# Patient Record
Sex: Female | Born: 1980 | Race: Black or African American | Hispanic: No | Marital: Single | Smoking: Never smoker
Health system: Southern US, Community
[De-identification: ages and names within clinical notes are randomized; demographics above are authoritative.]

## PROBLEM LIST (undated history)

## (undated) ENCOUNTER — Inpatient Hospital Stay (HOSPITAL_COMMUNITY): Payer: Self-pay

## (undated) DIAGNOSIS — IMO0002 Reserved for concepts with insufficient information to code with codable children: Secondary | ICD-10-CM

## (undated) DIAGNOSIS — K5909 Other constipation: Secondary | ICD-10-CM

## (undated) DIAGNOSIS — I10 Essential (primary) hypertension: Secondary | ICD-10-CM

## (undated) DIAGNOSIS — R87619 Unspecified abnormal cytological findings in specimens from cervix uteri: Secondary | ICD-10-CM

## (undated) DIAGNOSIS — E559 Vitamin D deficiency, unspecified: Secondary | ICD-10-CM

## (undated) HISTORY — PX: WISDOM TOOTH EXTRACTION: SHX21

## (undated) HISTORY — DX: Vitamin D deficiency, unspecified: E55.9

## (undated) HISTORY — DX: Essential (primary) hypertension: I10

## (undated) HISTORY — PX: OTHER SURGICAL HISTORY: SHX169

---
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
## 2011-07-16 ENCOUNTER — Encounter: Payer: Self-pay | Admitting: Emergency Medicine

## 2011-07-16 ENCOUNTER — Emergency Department (HOSPITAL_COMMUNITY)
Admission: EM | Admit: 2011-07-16 | Discharge: 2011-07-16 | Disposition: A | Discharge status: Home / Self Care | Payer: Medicaid Other | Attending: Emergency Medicine | Admitting: Emergency Medicine

## 2011-07-16 DIAGNOSIS — R6883 Chills (without fever): Secondary | ICD-10-CM | POA: Insufficient documentation

## 2011-07-16 DIAGNOSIS — R6889 Other general symptoms and signs: Secondary | ICD-10-CM | POA: Insufficient documentation

## 2011-07-16 DIAGNOSIS — IMO0001 Reserved for inherently not codable concepts without codable children: Secondary | ICD-10-CM | POA: Insufficient documentation

## 2011-07-16 DIAGNOSIS — R059 Cough, unspecified: Secondary | ICD-10-CM | POA: Insufficient documentation

## 2011-07-16 DIAGNOSIS — J069 Acute upper respiratory infection, unspecified: Secondary | ICD-10-CM | POA: Insufficient documentation

## 2011-07-16 DIAGNOSIS — R05 Cough: Secondary | ICD-10-CM

## 2011-07-16 MED ORDER — HYDROCOD POLST-CHLORPHEN POLST 10-8 MG/5ML PO LQCR: 5.0000 mL | Freq: Two times a day (BID) | ORAL | Status: DC | PRN

## 2011-07-16 NOTE — ED Notes (Signed)
Pt. C/o productive cough x's 4 days.

## 2011-07-16 NOTE — ED Provider Notes (Signed)
Medical screening examination/treatment/procedure(s) were performed by non-physician practitioner and as supervising physician I was immediately available for consultation/collaboration.  Flint Melter, MD 07/16/11 [phone]

## 2011-07-16 NOTE — ED Provider Notes (Signed)
History    this is a generally healthy 30 year old female presents to the ED with a chief complaint cough. Patient states for the past 4 days she has had intermittent cough. Cough was initially nonproductive but now, is productive with white sputum. She does complain of sneezing, runny nose, body aches, and chills. She denies nausea, vomiting, abdominal pain, back pain, dysurea, rash or medication changes. She has tried over-the-counter medication without relief. She is here today because her cough is persistent.  CSN: [id number] Arrival date & time: 07/16/2011  4:01 PM   First MD Initiated Contact with Patient 07/16/11 1732      Chief Complaint  Patient presents with  . Cough    (Consider location/radiation/quality/duration/timing/severity/associated sxs/prior treatment) HPI  History reviewed. No pertinent past medical history.  History reviewed. No pertinent past surgical history.  No family history on file.  History  Substance Use Topics  . Smoking status: Never Smoker   . Smokeless tobacco: Not on file  . Alcohol Use: No    OB History    Grav Para Term Preterm Abortions TAB SAB Ect Mult Living                  Review of Systems  All other systems reviewed and are negative.    Allergies  Review of patient's allergies indicates no known allergies.  Home Medications   Current Outpatient Rx  Name Route Sig Dispense Refill  . ACETAMINOPHEN 160 MG/5ML PO SOLN Oral Take 15 mg/kg by mouth every 6 (six) hours as needed. For fever and flu per patient       BP 127/81  Pulse 87  Temp(Src) 99.1 F (37.3 C) (Oral)  Resp 20  SpO2 100%  Physical Exam  Nursing note and vitals reviewed. Constitutional: She is oriented to person, place, and time. She appears well-developed and well-nourished.       Awake, alert, nontoxic appearance  HENT:  Head: Atraumatic.  Right Ear: External ear normal.  Left Ear: External ear normal.  Nose: Nose normal.  Mouth/Throat:  Oropharynx is clear and moist. No oropharyngeal exudate.  Eyes: Conjunctivae are normal. Right eye exhibits no discharge. Left eye exhibits no discharge.  Neck: Neck supple.  Cardiovascular: Normal rate and regular rhythm.  Exam reveals no gallop and no friction rub.   No murmur heard. Pulmonary/Chest: Effort normal and breath sounds normal. No respiratory distress. She has no wheezes. She has no rales. She exhibits no tenderness.       Non productive cough  Abdominal: There is no tenderness. There is no rebound.  Musculoskeletal: She exhibits no tenderness.       Baseline ROM, no obvious new focal weakness  Neurological: She is alert and oriented to person, place, and time.       Mental status and motor strength appears baseline for patient and situation  Skin: Skin is warm and dry. No rash noted.  Psychiatric: She has a normal mood and affect.    ED Course  Procedures (including critical care time)  Labs Reviewed - No data to display No results found.   No diagnosis found.    MDM  Pt symptoms consistence with an upper respiratory infection.  Lungs clear to auscultation and she is afebrile. I will prescribe cough medication. She is able to tolerate by mouth, and in no acute distress.       Fayrene Helper, PA 07/16/11 1743

## 2011-08-19 ENCOUNTER — Emergency Department (HOSPITAL_COMMUNITY)
Admission: EM | Admit: 2011-08-19 | Discharge: 2011-08-20 | Disposition: A | Discharge status: Home / Self Care | Payer: No Typology Code available for payment source | Attending: Emergency Medicine | Admitting: Emergency Medicine

## 2011-08-19 ENCOUNTER — Emergency Department (HOSPITAL_COMMUNITY): Payer: No Typology Code available for payment source

## 2011-08-19 ENCOUNTER — Encounter (HOSPITAL_COMMUNITY): Payer: Self-pay | Admitting: Pediatric Emergency Medicine

## 2011-08-19 DIAGNOSIS — M545 Low back pain, unspecified: Secondary | ICD-10-CM | POA: Insufficient documentation

## 2011-08-19 DIAGNOSIS — R109 Unspecified abdominal pain: Secondary | ICD-10-CM | POA: Insufficient documentation

## 2011-08-19 DIAGNOSIS — O99891 Other specified diseases and conditions complicating pregnancy: Secondary | ICD-10-CM | POA: Insufficient documentation

## 2011-08-19 DIAGNOSIS — R10819 Abdominal tenderness, unspecified site: Secondary | ICD-10-CM | POA: Insufficient documentation

## 2011-08-19 MED ORDER — OXYCODONE-ACETAMINOPHEN 5-325 MG PO TABS
1.0000 | ORAL_TABLET | Freq: Once | ORAL | Status: AC
  Administered 2011-08-19: 1 via ORAL
  Filled 2011-08-19: qty 1

## 2011-08-19 NOTE — ED Notes (Signed)
Pt alert lying on stretcher with daughter.
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
## 2011-08-20 NOTE — ED Provider Notes (Signed)
Medical screening examination/treatment/procedure(s) were performed by non-physician practitioner and as supervising physician I was immediately available for consultation/collaboration.  Leeman Johnsey R. Adna Nofziger, MD 08/20/11 1622 

## 2011-09-02 ENCOUNTER — Ambulatory Visit: Payer: No Typology Code available for payment source | Admitting: Internal Medicine

## 2011-12-08 ENCOUNTER — Observation Stay (HOSPITAL_COMMUNITY)
Admission: AD | Admit: 2011-12-08 | Discharge: 2011-12-09 | Disposition: A | Discharge status: Home / Self Care | Payer: Medicaid Other | Source: Ambulatory Visit | Attending: Family Medicine | Admitting: Family Medicine

## 2011-12-08 ENCOUNTER — Observation Stay (HOSPITAL_COMMUNITY): Payer: Medicaid Other

## 2011-12-08 ENCOUNTER — Encounter (HOSPITAL_COMMUNITY): Payer: Self-pay | Admitting: *Deleted

## 2011-12-08 ENCOUNTER — Emergency Department (HOSPITAL_COMMUNITY)
Admission: EM | Admit: 2011-12-08 | Discharge: 2011-12-08 | Disposition: A | Discharge status: Other Acute Inpatient Hospital | Payer: Medicaid Other | Source: Home / Self Care | Attending: Emergency Medicine | Admitting: Emergency Medicine

## 2011-12-08 ENCOUNTER — Encounter (HOSPITAL_COMMUNITY): Payer: Self-pay | Admitting: Emergency Medicine

## 2011-12-08 DIAGNOSIS — Y93F9 Activity, other caregiving: Secondary | ICD-10-CM | POA: Insufficient documentation

## 2011-12-08 DIAGNOSIS — IMO0002 Reserved for concepts with insufficient information to code with codable children: Secondary | ICD-10-CM | POA: Insufficient documentation

## 2011-12-08 DIAGNOSIS — O4700 False labor before 37 completed weeks of gestation, unspecified trimester: Principal | ICD-10-CM | POA: Insufficient documentation

## 2011-12-08 DIAGNOSIS — R109 Unspecified abdominal pain: Secondary | ICD-10-CM | POA: Insufficient documentation

## 2011-12-08 DIAGNOSIS — O9A219 Injury, poisoning and certain other consequences of external causes complicating pregnancy, unspecified trimester: Secondary | ICD-10-CM

## 2011-12-08 DIAGNOSIS — S3981XA Other specified injuries of abdomen, initial encounter: Secondary | ICD-10-CM | POA: Diagnosis present

## 2011-12-08 DIAGNOSIS — O9989 Other specified diseases and conditions complicating pregnancy, childbirth and the puerperium: Secondary | ICD-10-CM | POA: Insufficient documentation

## 2011-12-08 DIAGNOSIS — Y990 Civilian activity done for income or pay: Secondary | ICD-10-CM | POA: Insufficient documentation

## 2011-12-08 DIAGNOSIS — S3991XA Unspecified injury of abdomen, initial encounter: Secondary | ICD-10-CM

## 2011-12-08 HISTORY — DX: Other constipation: K59.09

## 2011-12-08 HISTORY — DX: Reserved for concepts with insufficient information to code with codable children: IMO0002

## 2011-12-08 HISTORY — DX: Unspecified abnormal cytological findings in specimens from cervix uteri: R87.619

## 2011-12-08 LAB — URINALYSIS, ROUTINE W REFLEX MICROSCOPIC
Bilirubin Urine: NEGATIVE
Nitrite: NEGATIVE

## 2011-12-08 LAB — CBC
HCT: 30.4 % — ABNORMAL LOW (ref 36.0–46.0)
Hemoglobin: 9.9 g/dL — ABNORMAL LOW (ref 12.0–15.0)
MCH: 30.3 pg (ref 26.0–34.0)
MCH: 29.9 pg (ref 26.0–34.0)
MCHC: 33.9 g/dL (ref 30.0–36.0)
MCV: 89.4 fL (ref 78.0–100.0)
MCV: 91.2 fL (ref 78.0–100.0)
Platelets: 218 10*3/uL (ref 150–400)
Platelets: 205 10*3/uL (ref 150–400)
RBC: 3.31 MIL/uL — ABNORMAL LOW (ref 3.87–5.11)
RDW: 13.4 % (ref 11.5–15.5)
WBC: 10.1 10*3/uL (ref 4.0–10.5)

## 2011-12-08 LAB — DIFFERENTIAL
Basophils Absolute: 0 10*3/uL (ref 0.0–0.1)
Basophils Relative: 0 % (ref 0–1)
Eosinophils Absolute: 0 10*3/uL (ref 0.0–0.7)
Eosinophils Relative: 0 % (ref 0–5)
Monocytes Absolute: 0.5 10*3/uL (ref 0.1–1.0)

## 2011-12-08 LAB — BASIC METABOLIC PANEL
Calcium: 8.6 mg/dL (ref 8.4–10.5)
Creatinine, Ser: 0.45 mg/dL — ABNORMAL LOW (ref 0.50–1.10)
GFR calc non Af Amer: >90 mL/min (ref >90)
Sodium: 135 mEq/L (ref 135–145)

## 2011-12-08 LAB — RAPID URINE DRUG SCREEN, HOSP PERFORMED
Barbiturates: NOT DETECTED
Benzodiazepines: NOT DETECTED
Cocaine: NOT DETECTED

## 2011-12-08 MED ORDER — DOCUSATE SODIUM 100 MG PO CAPS
100.0000 mg | ORAL_CAPSULE | Freq: Every day | ORAL | Status: DC
  Administered 2011-12-08: 100 mg via ORAL
  Filled 2011-12-08 (×3): qty 1

## 2011-12-08 MED ORDER — CALCIUM CARBONATE ANTACID 500 MG PO CHEW
2.0000 | CHEWABLE_TABLET | ORAL | Status: DC | PRN
  Filled 2011-12-08: qty 2

## 2011-12-08 MED ORDER — FENTANYL CITRATE 0.05 MG/ML IJ SOLN
50.0000 ug | Freq: Once | INTRAMUSCULAR | Status: DC
  Filled 2011-12-08: qty 2

## 2011-12-08 MED ORDER — LACTATED RINGERS IV SOLN
INTRAVENOUS | Status: DC
  Administered 2011-12-08 – 2011-12-09 (×3): via INTRAVENOUS

## 2011-12-08 MED ORDER — ZOLPIDEM TARTRATE 10 MG PO TABS
10.0000 mg | ORAL_TABLET | Freq: Every evening | ORAL | Status: DC | PRN
  Administered 2011-12-08: 10 mg via ORAL
  Filled 2011-12-08: qty 1

## 2011-12-08 MED ORDER — ACETAMINOPHEN 325 MG PO TABS
650.0000 mg | ORAL_TABLET | ORAL | Status: DC | PRN
  Administered 2011-12-08: 650 mg via ORAL
  Filled 2011-12-08: qty 2

## 2011-12-08 MED ORDER — PRENATAL MULTIVITAMIN CH
1.0000 | ORAL_TABLET | Freq: Every day | ORAL | Status: DC
  Administered 2011-12-08: 1 via ORAL
  Filled 2011-12-08 (×3): qty 1
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
## 2011-12-08 NOTE — H&P (Signed)
I have seen and examined this pt.  I agree with the above history and exam findings.  FHR shows 150's, average variability (10x10) without accels, and with some decels with contractions that are shallow.  U/S shows no placental abnormality and cx is 3.8 cm and closed. Will check KB. Per pt. She is O pos.
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
## 2011-12-08 NOTE — ED Provider Notes (Signed)
Medical screening examination/treatment/procedure(s) were conducted as a shared visit with non-physician practitioner(s) and myself.  I personally evaluated the patient during the encounter  Doug Sou, MD 12/08/11 1724

## 2011-12-08 NOTE — Progress Notes (Signed)
[facility] notified of pt transfer to [facility].
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
## 2011-12-08 NOTE — Progress Notes (Signed)
Report called to Clarene Essex, RN at [facility]

## 2011-12-09 DIAGNOSIS — O9A219 Injury, poisoning and certain other consequences of external causes complicating pregnancy, unspecified trimester: Secondary | ICD-10-CM | POA: Diagnosis present

## 2011-12-09 DIAGNOSIS — T1490XA Injury, unspecified, initial encounter: Secondary | ICD-10-CM

## 2011-12-09 DIAGNOSIS — S3981XA Other specified injuries of abdomen, initial encounter: Secondary | ICD-10-CM | POA: Diagnosis present
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
## 2011-12-09 NOTE — Progress Notes (Signed)
UR chart review completed.  
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
## 2012-06-24 DIAGNOSIS — K5900 Constipation, unspecified: Secondary | ICD-10-CM | POA: Insufficient documentation

## 2012-06-24 DIAGNOSIS — L910 Hypertrophic scar: Secondary | ICD-10-CM | POA: Insufficient documentation

## 2012-06-24 DIAGNOSIS — Z975 Presence of (intrauterine) contraceptive device: Secondary | ICD-10-CM

## 2012-06-24 HISTORY — DX: Presence of (intrauterine) contraceptive device: Z97.5

## 2012-06-24 HISTORY — DX: Hypertrophic scar: L91.0

## 2012-11-10 DIAGNOSIS — A6000 Herpesviral infection of urogenital system, unspecified: Secondary | ICD-10-CM | POA: Insufficient documentation

## 2013-09-22 ENCOUNTER — Encounter (HOSPITAL_COMMUNITY): Payer: Self-pay | Admitting: Emergency Medicine

## 2013-09-22 ENCOUNTER — Emergency Department (HOSPITAL_COMMUNITY)
Admission: EM | Admit: 2013-09-22 | Discharge: 2013-09-22 | Disposition: A | Discharge status: Home / Self Care | Payer: PRIVATE HEALTH INSURANCE | Source: Home / Self Care | Attending: Family Medicine | Admitting: Family Medicine

## 2013-09-22 ENCOUNTER — Emergency Department (HOSPITAL_COMMUNITY)
Admission: EM | Admit: 2013-09-22 | Discharge: 2013-09-23 | Disposition: A | Discharge status: Home / Self Care | Payer: PRIVATE HEALTH INSURANCE | Attending: Emergency Medicine | Admitting: Emergency Medicine

## 2013-09-22 ENCOUNTER — Emergency Department (HOSPITAL_COMMUNITY): Payer: PRIVATE HEALTH INSURANCE

## 2013-09-22 DIAGNOSIS — R142 Eructation: Secondary | ICD-10-CM | POA: Insufficient documentation

## 2013-09-22 DIAGNOSIS — K5900 Constipation, unspecified: Secondary | ICD-10-CM | POA: Diagnosis not present

## 2013-09-22 DIAGNOSIS — B9689 Other specified bacterial agents as the cause of diseases classified elsewhere: Secondary | ICD-10-CM | POA: Diagnosis not present

## 2013-09-22 DIAGNOSIS — R1110 Vomiting, unspecified: Secondary | ICD-10-CM

## 2013-09-22 DIAGNOSIS — R1012 Left upper quadrant pain: Secondary | ICD-10-CM | POA: Diagnosis not present

## 2013-09-22 DIAGNOSIS — R109 Unspecified abdominal pain: Secondary | ICD-10-CM

## 2013-09-22 DIAGNOSIS — Z9089 Acquired absence of other organs: Secondary | ICD-10-CM | POA: Diagnosis not present

## 2013-09-22 DIAGNOSIS — N760 Acute vaginitis: Secondary | ICD-10-CM | POA: Insufficient documentation

## 2013-09-22 DIAGNOSIS — Z9889 Other specified postprocedural states: Secondary | ICD-10-CM | POA: Insufficient documentation

## 2013-09-22 DIAGNOSIS — A499 Bacterial infection, unspecified: Secondary | ICD-10-CM | POA: Insufficient documentation

## 2013-09-22 DIAGNOSIS — R141 Gas pain: Secondary | ICD-10-CM | POA: Diagnosis not present

## 2013-09-22 DIAGNOSIS — R112 Nausea with vomiting, unspecified: Secondary | ICD-10-CM | POA: Diagnosis not present

## 2013-09-22 DIAGNOSIS — R509 Fever, unspecified: Secondary | ICD-10-CM | POA: Insufficient documentation

## 2013-09-22 DIAGNOSIS — R143 Flatulence: Secondary | ICD-10-CM

## 2013-09-22 LAB — URINALYSIS, ROUTINE W REFLEX MICROSCOPIC
BILIRUBIN URINE: NEGATIVE
Bilirubin Urine: NEGATIVE
GLUCOSE, UA: NEGATIVE mg/dL
Glucose, UA: NEGATIVE mg/dL
HGB URINE DIPSTICK: NEGATIVE
HGB URINE DIPSTICK: NEGATIVE
Ketones, ur: NEGATIVE mg/dL
Ketones, ur: NEGATIVE mg/dL
Leukocytes, UA: NEGATIVE
Nitrite: NEGATIVE
Nitrite: NEGATIVE
PH: 7.5 (ref 5.0–8.0)
PH: 7 (ref 5.0–8.0)
Protein, ur: NEGATIVE mg/dL
Protein, ur: NEGATIVE mg/dL
SPECIFIC GRAVITY, URINE: 1.025 (ref 1.005–1.030)
SPECIFIC GRAVITY, URINE: 1.027 (ref 1.005–1.030)
Urobilinogen, UA: 1 mg/dL (ref 0.0–1.0)
Urobilinogen, UA: 1 mg/dL (ref 0.0–1.0)

## 2013-09-22 LAB — COMPREHENSIVE METABOLIC PANEL
ALBUMIN: 3.5 g/dL (ref 3.5–5.2)
ALK PHOS: 100 U/L (ref 39–117)
ALT: 16 U/L (ref 0–35)
AST: 17 U/L (ref 0–37)
BILIRUBIN TOTAL: 0.4 mg/dL (ref 0.3–1.2)
BUN: 11 mg/dL (ref 6–23)
CHLORIDE: 102 meq/L (ref 96–112)
CO2: 24 meq/L (ref 19–32)
Calcium: 8.9 mg/dL (ref 8.4–10.5)
Creatinine, Ser: 0.65 mg/dL (ref 0.50–1.10)
GFR calc Af Amer: >90 mL/min (ref >90)
Glucose, Bld: 89 mg/dL (ref 70–99)
POTASSIUM: 4.1 meq/L (ref 3.7–5.3)
Sodium: 138 mEq/L (ref 137–147)
Total Protein: 7.8 g/dL (ref 6.0–8.3)

## 2013-09-22 LAB — CBC WITH DIFFERENTIAL/PLATELET
BASOS PCT: 0 % (ref 0–1)
Basophils Absolute: 0 10*3/uL (ref 0.0–0.1)
Eosinophils Absolute: 0 10*3/uL (ref 0.0–0.7)
Eosinophils Relative: 0 % (ref 0–5)
HEMATOCRIT: 37.6 % (ref 36.0–46.0)
HEMOGLOBIN: 12.6 g/dL (ref 12.0–15.0)
LYMPHS PCT: 9 % — AB (ref 12–46)
Lymphs Abs: 0.7 10*3/uL (ref 0.7–4.0)
MCH: 30.4 pg (ref 26.0–34.0)
MCHC: 33.5 g/dL (ref 30.0–36.0)
MCV: 90.8 fL (ref 78.0–100.0)
MONOS PCT: 3 % (ref 3–12)
Monocytes Absolute: 0.3 10*3/uL (ref 0.1–1.0)
NEUTROS ABS: 6.8 10*3/uL (ref 1.7–7.7)
NEUTROS PCT: 88 % — AB (ref 43–77)
Platelets: 230 10*3/uL (ref 150–400)
RBC: 4.14 MIL/uL (ref 3.87–5.11)
RDW: 13.4 % (ref 11.5–15.5)
WBC: 7.7 10*3/uL (ref 4.0–10.5)

## 2013-09-22 LAB — URINE MICROSCOPIC-ADD ON

## 2013-09-22 LAB — WET PREP, GENITAL
TRICH WET PREP: NONE SEEN
Yeast Wet Prep HPF POC: NONE SEEN

## 2013-09-22 LAB — PREGNANCY, URINE: PREG TEST UR: NEGATIVE

## 2013-09-22 LAB — POCT PREGNANCY, URINE: Preg Test, Ur: NEGATIVE

## 2013-09-22 MED ORDER — IOHEXOL 300 MG/ML  SOLN
100.0000 mL | Freq: Once | INTRAMUSCULAR | Status: AC | PRN
  Administered 2013-09-22: 100 mL via INTRAVENOUS

## 2013-09-22 MED ORDER — ONDANSETRON 4 MG PO TBDP
ORAL_TABLET | ORAL | Status: AC
  Filled 2013-09-22: qty 2

## 2013-09-22 MED ORDER — ONDANSETRON 4 MG PO TBDP
8.0000 mg | ORAL_TABLET | Freq: Once | ORAL | Status: AC
  Administered 2013-09-22: 8 mg via ORAL

## 2013-09-22 MED ORDER — IOHEXOL 300 MG/ML  SOLN
25.0000 mL | Freq: Once | INTRAMUSCULAR | Status: AC | PRN
  Administered 2013-09-22: 25 mL via ORAL

## 2013-09-22 MED ORDER — ONDANSETRON HCL 4 MG/2ML IJ SOLN
4.0000 mg | Freq: Once | INTRAMUSCULAR | Status: AC
  Administered 2013-09-22: 4 mg via INTRAVENOUS
  Filled 2013-09-22: qty 2

## 2013-09-22 MED ORDER — ONDANSETRON HCL 4 MG/2ML IJ SOLN
4.0000 mg | Freq: Once | INTRAMUSCULAR | Status: AC
  Administered 2013-09-23: 4 mg via INTRAVENOUS
  Filled 2013-09-22: qty 2

## 2013-09-22 MED ORDER — MORPHINE SULFATE 4 MG/ML IJ SOLN
4.0000 mg | Freq: Once | INTRAMUSCULAR | Status: AC
  Administered 2013-09-22: 4 mg via INTRAVENOUS
  Filled 2013-09-22: qty 1

## 2013-09-22 MED ORDER — METRONIDAZOLE 500 MG PO TABS: 500.0000 mg | ORAL_TABLET | Freq: Two times a day (BID) | ORAL | Status: DC

## 2013-09-22 MED ORDER — SODIUM CHLORIDE 0.9 % IV BOLUS (SEPSIS)
1000.0000 mL | Freq: Once | INTRAVENOUS | Status: AC
  Administered 2013-09-22: 1000 mL via INTRAVENOUS

## 2013-09-22 NOTE — ED Provider Notes (Signed)
I saw and evaluated the patient, reviewed the resident's note and I agree with the findings and plan.   Mild diffuse abdominal tenderness without rebound.  Hurman HornJohn M Bula Cavalieri, MD 09/23/13 1420

## 2013-09-22 NOTE — ED Notes (Signed)
MD at bedside. 

## 2013-09-22 NOTE — ED Provider Notes (Signed)
Maria CoupConstance Grimes is a 33 y.o. female who presents to Urgent Care today for abdominal pain and vomiting.  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 This has resulted in what sounds like adhesions. She has had reportedly 2 episodes of small bowel obstruction most recently in the last years or so. These have required several day course of hospitalization in the past. She has had 3 days of vomiting with worsening abdominal pain. The vomiting appears to be feculent. She notes that she has not had a bowel movement in the last 5 days. This is unusual for her. She does also note some suprapubic pain and heavier than usual vaginal discharge. She is eating less than usual. She has tried Tylenol for abdominal pain which has helped. She denies any aspirin or NSAIDs. No fevers or chills.   Past Medical History  Diagnosis Date  . Abnormal Pap smear   . Constipation, chronic    History  Substance Use Topics  . Smoking status: Never Smoker   . Smokeless tobacco: Not on file  . Alcohol Use: No   ROS as above Medications: No current facility-administered medications for this encounter.   Current Outpatient Prescriptions  Medication Sig Dispense Refill  . Prenatal Vit-Fe Fumarate-FA (PRENATAL MULTIVITAMIN) TABS Take 1 tablet by mouth daily.        Exam:  BP 111/85  Pulse 95  Temp(Src) 99.3 F (37.4 C) (Oral)  SpO2 100%  LMP 08/17/2013  Breastfeeding? Unknown Gen: Uncomfortable appearing HEENT: EOMI,  MMM Lungs: Normal work of breathing. CTABL Heart: RRR no MRG Abd: Large mature  midline abdominal scar hypoactive bowel sounds, Soft. Nondistended. Tender palpation upper left quadrant. Guarding present no rebound. Exts: Brisk capillary refill, warm and well perfused.   Results for orders placed during the hospital encounter of 09/22/13 (from the past 24 hour(s))  POCT PREGNANCY, URINE     Status: None   Collection Time    09/22/13  5:37 PM      Result  Value Ref Range   Preg Test, Ur NEGATIVE  NEGATIVE    No results found.  Assessment and Plan: 33 y.o. female with abdominal pain with vomiting and constipation with history of small bowel obstruction.  I am concerned that the patient may be having a repeat small bowel obstruction. Her vomiting sounds to be feculent. Alternative differential diagnosis may be gastritis.  Regardless I suspect that given her history we will be unable to fully evaluate and treat the patient here in the urgent care. Transfer to the emergency room for further evaluation and management.  Discussed warning signs or symptoms. Please see discharge instructions. Patient expresses understanding.    Rodolph BongEvan S Amahd Morino, MD 09/22/13 1739

## 2013-09-22 NOTE — Discharge Instructions (Signed)
Abdominal Pain, Adult Many things can cause abdominal pain. Usually, abdominal pain is not caused by a disease and will improve without treatment. It can often be observed and treated at home. Your health care provider will do a physical exam and possibly order blood tests and X-rays to help determine the seriousness of your pain. However, in many cases, more time must pass before a clear cause of the pain can be found. Before that point, your health care provider may not know if you need more testing or further treatment. HOME CARE INSTRUCTIONS  Monitor your abdominal pain for any changes. The following actions may help to alleviate any discomfort you are experiencing:  Only take over-the-counter or prescription medicines as directed by your health care provider.  Do not take laxatives unless directed to do so by your health care provider.  Try a clear liquid diet (broth, tea, or water) as directed by your health care provider. Slowly move to a bland diet as tolerated. SEEK MEDICAL CARE IF:  You have unexplained abdominal pain.  You have abdominal pain associated with nausea or diarrhea.  You have pain when you urinate or have a bowel movement.  You experience abdominal pain that wakes you in the night.  You have abdominal pain that is worsened or improved by eating food.  You have abdominal pain that is worsened with eating fatty foods. SEEK IMMEDIATE MEDICAL CARE IF:   Your pain does not go away within 2 hours.  You have a fever.  You keep throwing up (vomiting).  Your pain is felt only in portions of the abdomen, such as the right side or the left lower portion of the abdomen.  You pass bloody or black tarry stools. MAKE SURE YOU:  Understand these instructions.   Will watch your condition.   Will get help right away if you are not doing well or get worse.  Document Released: 04/29/2005 Document Revised: 05/10/2013 Document Reviewed: 03/29/2013 [facility] Patient  Information 2014 [place], [place].  Bacterial Vaginosis Bacterial vaginosis is an infection of the vagina. It happens when too many of certain germs (bacteria) grow in the vagina. HOME CARE  Take your medicine as told by your doctor.  Finish your medicine even if you start to feel better.  Do not have sex until you finish your medicine and are better.  Tell your sex partner that you have an infection. They should see their doctor for treatment.  Practice safe sex. Use condoms. Have only one sex partner. GET HELP IF:  You are not getting better after 3 days of treatment.  You have more grey fluid (discharge) coming from your vagina than before.  You have more pain than before.  You have a fever. MAKE SURE YOU:   Understand these instructions.  Will watch your condition.  Will get help right away if you are not doing well or get worse. Document Released: 04/28/2008 Document Revised: 05/10/2013 Document Reviewed: 03/01/2013 [facility] Patient Information 2014 [place]Care, [place].

## 2013-09-22 NOTE — ED Notes (Signed)
Pt was sent down from ucc and had a zofran there

## 2013-09-22 NOTE — ED Notes (Signed)
The pt has been ill since Monday with abd pain with nv and diarrhea since Tuesday. She has not been able to eat and she thinks she has had a temp.  lmp jan 15th

## 2013-09-22 NOTE — ED Notes (Signed)
C/o flu like symptoms since Tuesday, c/o chills, body aches, unable to keep down any food
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
## 2013-09-25 LAB — GC/CHLAMYDIA PROBE AMP
CT PROBE, AMP APTIMA: NEGATIVE
GC PROBE AMP APTIMA: NEGATIVE

## 2014-06-04 ENCOUNTER — Encounter (HOSPITAL_COMMUNITY): Payer: Self-pay | Admitting: Emergency Medicine

## 2015-04-18 ENCOUNTER — Emergency Department (HOSPITAL_COMMUNITY)
Admission: EM | Admit: 2015-04-18 | Discharge: 2015-04-18 | Disposition: A | Discharge status: Home / Self Care | Payer: Medicaid Other | Attending: Emergency Medicine | Admitting: Emergency Medicine

## 2015-04-18 ENCOUNTER — Emergency Department (HOSPITAL_COMMUNITY): Payer: Medicaid Other

## 2015-04-18 ENCOUNTER — Encounter (HOSPITAL_COMMUNITY): Payer: Self-pay | Admitting: Emergency Medicine

## 2015-04-18 ENCOUNTER — Inpatient Hospital Stay (HOSPITAL_COMMUNITY)
Admission: AD | Admit: 2015-04-18 | Discharge: 2015-04-18 | Discharge status: Against Medical Advice | Payer: Medicaid Other | Source: Ambulatory Visit | Attending: Family Medicine | Admitting: Family Medicine

## 2015-04-18 DIAGNOSIS — Z5320 Procedure and treatment not carried out because of patient's decision for unspecified reasons: Secondary | ICD-10-CM | POA: Insufficient documentation

## 2015-04-18 DIAGNOSIS — IMO0002 Reserved for concepts with insufficient information to code with codable children: Secondary | ICD-10-CM

## 2015-04-18 DIAGNOSIS — O4690 Antepartum hemorrhage, unspecified, unspecified trimester: Secondary | ICD-10-CM

## 2015-04-18 DIAGNOSIS — N760 Acute vaginitis: Secondary | ICD-10-CM

## 2015-04-18 DIAGNOSIS — Z3A09 9 weeks gestation of pregnancy: Secondary | ICD-10-CM | POA: Diagnosis not present

## 2015-04-18 DIAGNOSIS — Z8719 Personal history of other diseases of the digestive system: Secondary | ICD-10-CM | POA: Insufficient documentation

## 2015-04-18 DIAGNOSIS — O9989 Other specified diseases and conditions complicating pregnancy, childbirth and the puerperium: Secondary | ICD-10-CM | POA: Diagnosis not present

## 2015-04-18 DIAGNOSIS — O23591 Infection of other part of genital tract in pregnancy, first trimester: Secondary | ICD-10-CM | POA: Insufficient documentation

## 2015-04-18 DIAGNOSIS — O209 Hemorrhage in early pregnancy, unspecified: Secondary | ICD-10-CM | POA: Diagnosis present

## 2015-04-18 DIAGNOSIS — O200 Threatened abortion: Secondary | ICD-10-CM

## 2015-04-18 DIAGNOSIS — R109 Unspecified abdominal pain: Secondary | ICD-10-CM | POA: Diagnosis present

## 2015-04-18 DIAGNOSIS — B9689 Other specified bacterial agents as the cause of diseases classified elsewhere: Secondary | ICD-10-CM

## 2015-04-18 DIAGNOSIS — Z3490 Encounter for supervision of normal pregnancy, unspecified, unspecified trimester: Secondary | ICD-10-CM

## 2015-04-18 DIAGNOSIS — M545 Low back pain: Secondary | ICD-10-CM | POA: Insufficient documentation

## 2015-04-18 LAB — WET PREP, GENITAL
Trich, Wet Prep: NONE SEEN
Yeast Wet Prep HPF POC: NONE SEEN

## 2015-04-18 LAB — CBC WITH DIFFERENTIAL/PLATELET
BASOS ABS: 0 10*3/uL (ref 0.0–0.1)
Basophils Relative: 0 %
EOS PCT: 1 %
Eosinophils Absolute: 0.1 10*3/uL (ref 0.0–0.7)
HCT: 34.4 % — ABNORMAL LOW (ref 36.0–46.0)
Hemoglobin: 11.5 g/dL — ABNORMAL LOW (ref 12.0–15.0)
LYMPHS ABS: 2.6 10*3/uL (ref 0.7–4.0)
Lymphocytes Relative: 28 %
MCH: 29.2 pg (ref 26.0–34.0)
MCHC: 33.4 g/dL (ref 30.0–36.0)
MCV: 87.3 fL (ref 78.0–100.0)
Monocytes Absolute: 0.4 10*3/uL (ref 0.1–1.0)
Monocytes Relative: 5 %
NEUTROS ABS: 6.1 10*3/uL (ref 1.7–7.7)
Neutrophils Relative %: 66 %
PLATELETS: 232 10*3/uL (ref 150–400)
RBC: 3.94 MIL/uL (ref 3.87–5.11)
RDW: 13.6 % (ref 11.5–15.5)
WBC: 9.1 10*3/uL (ref 4.0–10.5)

## 2015-04-18 LAB — TYPE AND SCREEN
ABO/RH(D): O POS
Antibody Screen: NEGATIVE

## 2015-04-18 LAB — URINALYSIS, ROUTINE W REFLEX MICROSCOPIC
BILIRUBIN URINE: NEGATIVE
Bilirubin Urine: NEGATIVE
GLUCOSE, UA: NEGATIVE mg/dL
GLUCOSE, UA: NEGATIVE mg/dL
HGB URINE DIPSTICK: NEGATIVE
Hgb urine dipstick: NEGATIVE
Ketones, ur: NEGATIVE mg/dL
Ketones, ur: NEGATIVE mg/dL
LEUKOCYTES UA: NEGATIVE
Leukocytes, UA: NEGATIVE
Nitrite: NEGATIVE
Nitrite: NEGATIVE
PH: 8 (ref 5.0–8.0)
PH: 6.5 (ref 5.0–8.0)
Protein, ur: NEGATIVE mg/dL
Protein, ur: NEGATIVE mg/dL
SPECIFIC GRAVITY, URINE: 1.027 (ref 1.005–1.030)
Specific Gravity, Urine: 1.02 (ref 1.005–1.030)
Urobilinogen, UA: 0.2 mg/dL (ref 0.0–1.0)
Urobilinogen, UA: 1 mg/dL (ref 0.0–1.0)

## 2015-04-18 LAB — ABO/RH: ABO/RH(D): O POS

## 2015-04-18 LAB — HCG, QUANTITATIVE, PREGNANCY: hCG, Beta Chain, Quant, S: 168796 m[IU]/mL — ABNORMAL HIGH (ref <5)

## 2015-04-18 LAB — POCT PREGNANCY, URINE: Preg Test, Ur: POSITIVE — AB

## 2015-04-18 MED ORDER — ACETAMINOPHEN 325 MG PO TABS
650.0000 mg | ORAL_TABLET | Freq: Once | ORAL | Status: AC
  Administered 2015-04-18: 650 mg via ORAL
  Filled 2015-04-18: qty 2

## 2015-04-18 MED ORDER — METRONIDAZOLE 500 MG PO TABS: 500.0000 mg | ORAL_TABLET | Freq: Two times a day (BID) | ORAL | Status: DC

## 2015-04-18 NOTE — Discharge Instructions (Signed)
Your ultrasound today did show that you have an 8 week fetus within her uterus. I your baby's heartbeat was elevated to 240s. You will require close follow-up with your [facility] physician. Please see them tomorrow as scheduled. Return without fail for worsening symptoms, including severe abdominal pain, vomiting unable to keep down food or fluids, worsening vaginal bleeding, or any other symptoms concerning to you.  First Trimester of Pregnancy The first trimester of pregnancy is from week 1 until the end of week 12 (months 1 through 3). During this time, your baby will begin to develop inside you. At 6-8 weeks, the eyes and face are formed, and the heartbeat can be seen on ultrasound. At the end of 12 weeks, all the baby's organs are formed. Prenatal care is all the medical care you receive before the birth of your baby. Make sure you get good prenatal care and follow all of your doctor's instructions. HOME CARE  Medicines  Take medicine only as told by your doctor. Some medicines are safe and some are not during pregnancy.  Take your prenatal vitamins as told by your doctor.  Take medicine that helps you poop (stool softener) as needed if your doctor says it is okay. Diet  Eat regular, healthy meals.  Your doctor will tell you the amount of weight gain that is right for you.  Avoid raw meat and uncooked cheese.  If you feel sick to your stomach (nauseous) or throw up (vomit):  Eat 4 or 5 small meals a day instead of 3 large meals.  Try eating a few soda crackers.  Drink liquids between meals instead of during meals.  If you have a hard time pooping (constipation):  Eat high-fiber foods like fresh vegetables, fruit, and whole grains.  Drink enough fluids to keep your pee (urine) clear or pale yellow. Activity and Exercise  Exercise only as told by your doctor. Stop exercising if you have cramps or pain in your lower belly (abdomen) or low back.  Try to avoid standing for long periods  of time. Move your legs often if you must stand in one place for a long time.  Avoid heavy lifting.  Wear low-heeled shoes. Sit and stand up straight.  You can have sex unless your doctor tells you not to. Relief of Pain or Discomfort  Wear a good support bra if your breasts are sore.  Take warm water baths (sitz baths) to soothe pain or discomfort caused by hemorrhoids. Use hemorrhoid cream if your doctor says it is okay.  Rest with your legs raised if you have leg cramps or low back pain.  Wear support hose if you have puffy, bulging veins (varicose veins) in your legs. Raise (elevate) your feet for 15 minutes, 3-4 times a day. Limit salt in your diet. Prenatal Care  Schedule your prenatal visits by the twelfth week of pregnancy.  Write down your questions. Take them to your prenatal visits.  Keep all your prenatal visits as told by your doctor. Safety  Wear your seat belt at all times when driving.  Make a list of emergency phone numbers. The list should include numbers for family, friends, the hospital, and police and fire departments. General Tips  Ask your doctor for a referral to a local prenatal class. Begin classes no later than at the start of month 6 of your pregnancy.  Ask for help if you need counseling or help with nutrition. Your doctor can give you advice or tell you where to  go for help.  Do not use hot tubs, steam rooms, or saunas.  Do not douche or use tampons or scented sanitary pads.  Do not cross your legs for long periods of time.  Avoid litter boxes and soil used by cats.  Avoid all smoking, herbs, and alcohol. Avoid drugs not approved by your doctor.  Visit your dentist. At home, brush your teeth with a soft toothbrush. Be gentle when you floss. GET HELP IF:  You are dizzy.  You have mild cramps or pressure in your lower belly.  You have a nagging pain in your belly area.  You continue to feel sick to your stomach, throw up, or have watery  poop (diarrhea).  You have a bad smelling fluid coming from your vagina.  You have pain with peeing (urination).  You have increased puffiness (swelling) in your face, hands, legs, or ankles. GET HELP RIGHT AWAY IF:   You have a fever.  You are leaking fluid from your vagina.  You have spotting or bleeding from your vagina.  You have very bad belly cramping or pain.  You gain or lose weight rapidly.  You throw up blood. It may look like coffee grounds.  You are around people who have [place] measles, fifth disease, or chickenpox.  You have a very bad headache.  You have shortness of breath.  You have any kind of trauma, such as from a fall or a car accident. Document Released: 01/06/2008 Document Revised: 12/04/2013 Document Reviewed: 05/30/2013 [facility] Patient Information 2015 [place], [place]. This information is not intended to replace advice given to you by your health care provider. Make sure you discuss any questions you have with your health care provider.  Threatened Miscarriage A threatened miscarriage occurs when you have vaginal bleeding during your first 20 weeks of pregnancy but the pregnancy has not ended. If you have vaginal bleeding during this time, your health care provider will do tests to make sure you are still pregnant. If the tests show you are still pregnant and the developing baby (fetus) inside your womb (uterus) is still growing, your condition is considered a threatened miscarriage. A threatened miscarriage does not mean your pregnancy will end, but it does increase the risk of losing your pregnancy (complete miscarriage). CAUSES  The cause of a threatened miscarriage is usually not known. If you go on to have a complete miscarriage, the most common cause is an abnormal number of chromosomes in the developing baby. Chromosomes are the structures inside cells that hold all your genetic material. Some causes of vaginal bleeding that do not result in  miscarriage include:  Having sex.  Having an infection.  Normal hormone changes of pregnancy.  Bleeding that occurs when an egg implants in your uterus. RISK FACTORS Risk factors for bleeding in early pregnancy include:  Obesity.  Smoking.  Drinking excessive amounts of alcohol or caffeine.  Recreational drug use. SIGNS AND SYMPTOMS  Light vaginal bleeding.  Mild abdominal pain or cramps. DIAGNOSIS  If you have bleeding with or without abdominal pain before 20 weeks of pregnancy, your health care provider will do tests to check whether you are still pregnant. One important test involves using sound waves and a computer (ultrasound) to create images of the inside of your uterus. Other tests include an internal exam of your vagina and uterus (pelvic exam) and measurement of your baby's heart rate.  You may be diagnosed with a threatened miscarriage if:  Ultrasound testing shows you are still pregnant.  Your baby's heart rate is strong.  A pelvic exam shows that the opening between your uterus and your vagina (cervix) is closed.  Your heart rate and blood pressure are stable.  Blood tests confirm you are still pregnant. TREATMENT  No treatments have been shown to prevent a threatened miscarriage from going on to a complete miscarriage. However, the right home care is important.  HOME CARE INSTRUCTIONS   Make sure you keep all your appointments for prenatal care. This is very important.  Get plenty of rest.  Do not have sex or use tampons if you have vaginal bleeding.  Do not douche.  Do not smoke or use recreational drugs.  Do not drink alcohol.  Avoid caffeine. SEEK MEDICAL CARE IF:  You have light vaginal bleeding or spotting while pregnant.  You have abdominal pain or cramping.  You have a fever. SEEK IMMEDIATE MEDICAL CARE IF:  You have heavy vaginal bleeding.  You have blood clots coming from your vagina.  You have severe low back pain or abdominal  cramps.  You have fever, chills, and severe abdominal pain. MAKE SURE YOU:  Understand these instructions.  Will watch your condition.  Will get help right away if you are not doing well or get worse. Document Released: 07/20/2005 Document Revised: 07/25/2013 Document Reviewed: 05/16/2013 [facility] Patient Information 2015 [place], [place]. This information is not intended to replace advice given to you by your health care provider. Make sure you discuss any questions you have with your health care provider.
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
## 2015-04-18 NOTE — Progress Notes (Signed)
Pt called in lobby and no response.

## 2015-04-18 NOTE — ED Notes (Signed)
Phlebotomy at the bedside  

## 2015-04-18 NOTE — MAU Note (Signed)
Pt reports she has had abd pain and back pain for over a week. OB is Dr. Shawnie Pons in [facility] has appointment tomorrow . Reports a yellow vag discharge with odor as well.

## 2015-04-18 NOTE — ED Notes (Signed)
Pt sts vaginal spotting yesterday and then again today; pt denies bleeding at present; pt sts approx [redacted] weeks pregnant with LMP 02/15/15; pt G4 P2 M1

## 2015-04-18 NOTE — ED Notes (Signed)
Called Dr. Joni Fears to report patient is ready to eat.

## 2015-04-18 NOTE — MAU Note (Signed)
Not in lobby

## 2015-04-18 NOTE — MAU Note (Signed)
Not in Lobby 

## 2015-04-19 LAB — GC/CHLAMYDIA PROBE AMP (~~LOC~~) NOT AT ARMC
CHLAMYDIA, DNA PROBE: NEGATIVE
NEISSERIA GONORRHEA: NEGATIVE

## 2015-07-09 ENCOUNTER — Inpatient Hospital Stay (HOSPITAL_COMMUNITY)
Admission: AD | Admit: 2015-07-09 | Discharge: 2015-07-09 | Disposition: A | Discharge status: Home / Self Care | Payer: Medicaid Other | Source: Ambulatory Visit | Attending: Obstetrics & Gynecology | Admitting: Obstetrics & Gynecology

## 2015-07-09 ENCOUNTER — Encounter (HOSPITAL_COMMUNITY): Payer: Self-pay | Admitting: *Deleted

## 2015-07-09 DIAGNOSIS — O26892 Other specified pregnancy related conditions, second trimester: Secondary | ICD-10-CM | POA: Insufficient documentation

## 2015-07-09 DIAGNOSIS — Z3A20 20 weeks gestation of pregnancy: Secondary | ICD-10-CM | POA: Insufficient documentation

## 2015-07-09 DIAGNOSIS — R200 Anesthesia of skin: Secondary | ICD-10-CM | POA: Diagnosis present

## 2015-07-09 DIAGNOSIS — G5600 Carpal tunnel syndrome, unspecified upper limb: Secondary | ICD-10-CM | POA: Insufficient documentation

## 2015-07-09 DIAGNOSIS — G5601 Carpal tunnel syndrome, right upper limb: Secondary | ICD-10-CM

## 2015-07-09 DIAGNOSIS — R202 Paresthesia of skin: Secondary | ICD-10-CM | POA: Diagnosis not present
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
## 2015-07-09 NOTE — Discharge Instructions (Signed)

## 2015-07-09 NOTE — MAU Note (Signed)
PT  SAYS SHE STARTED   HAVING  PAIN / NUMBNESS  IN RIGHT  HAND  AND  ARM  -    STARTED  ON 11-21.      GETS [facility]-   WITH DR  DORN- PLANS  TO DEL  THERE-      SHE  DID  NOT  CALL DR.

## 2015-08-16 ENCOUNTER — Emergency Department (HOSPITAL_COMMUNITY)
Admission: EM | Admit: 2015-08-16 | Discharge: 2015-08-16 | Disposition: A | Discharge status: Home / Self Care | Payer: Medicaid Other | Attending: Emergency Medicine | Admitting: Emergency Medicine

## 2015-08-16 ENCOUNTER — Encounter (HOSPITAL_COMMUNITY): Payer: Self-pay

## 2015-08-16 DIAGNOSIS — S6992XA Unspecified injury of left wrist, hand and finger(s), initial encounter: Secondary | ICD-10-CM | POA: Insufficient documentation

## 2015-08-16 DIAGNOSIS — Y998 Other external cause status: Secondary | ICD-10-CM | POA: Diagnosis not present

## 2015-08-16 DIAGNOSIS — S3992XA Unspecified injury of lower back, initial encounter: Secondary | ICD-10-CM | POA: Insufficient documentation

## 2015-08-16 DIAGNOSIS — Z79899 Other long term (current) drug therapy: Secondary | ICD-10-CM | POA: Insufficient documentation

## 2015-08-16 DIAGNOSIS — Z3A25 25 weeks gestation of pregnancy: Secondary | ICD-10-CM | POA: Insufficient documentation

## 2015-08-16 DIAGNOSIS — Y92410 Unspecified street and highway as the place of occurrence of the external cause: Secondary | ICD-10-CM | POA: Insufficient documentation

## 2015-08-16 DIAGNOSIS — Y9389 Activity, other specified: Secondary | ICD-10-CM | POA: Diagnosis not present

## 2015-08-16 DIAGNOSIS — O9A212 Injury, poisoning and certain other consequences of external causes complicating pregnancy, second trimester: Secondary | ICD-10-CM | POA: Diagnosis not present

## 2015-08-16 MED ORDER — ACETAMINOPHEN 500 MG PO TABS
1000.0000 mg | ORAL_TABLET | Freq: Once | ORAL | Status: AC
  Administered 2015-08-16: 1000 mg via ORAL
  Filled 2015-08-16: qty 2
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
## 2015-08-16 NOTE — ED Notes (Signed)
Olegario MessierKathy, [place] rapid [place] notified of pt and will come.
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
## 2015-09-10 ENCOUNTER — Inpatient Hospital Stay (HOSPITAL_COMMUNITY)
Admission: AD | Admit: 2015-09-10 | Discharge: 2015-09-11 | Disposition: A | Discharge status: Home / Self Care | Payer: Medicaid Other | Source: Ambulatory Visit | Attending: Family Medicine | Admitting: Family Medicine

## 2015-09-10 DIAGNOSIS — Z3A29 29 weeks gestation of pregnancy: Secondary | ICD-10-CM | POA: Insufficient documentation

## 2015-09-10 DIAGNOSIS — R197 Diarrhea, unspecified: Secondary | ICD-10-CM | POA: Insufficient documentation

## 2015-09-10 DIAGNOSIS — O26893 Other specified pregnancy related conditions, third trimester: Secondary | ICD-10-CM | POA: Insufficient documentation

## 2015-09-10 DIAGNOSIS — A084 Viral intestinal infection, unspecified: Secondary | ICD-10-CM

## 2015-09-10 DIAGNOSIS — O212 Late vomiting of pregnancy: Secondary | ICD-10-CM | POA: Insufficient documentation

## 2015-09-10 DIAGNOSIS — R112 Nausea with vomiting, unspecified: Secondary | ICD-10-CM | POA: Insufficient documentation

## 2015-09-11 ENCOUNTER — Encounter (HOSPITAL_COMMUNITY): Payer: Self-pay | Admitting: *Deleted

## 2015-09-11 DIAGNOSIS — Z3A29 29 weeks gestation of pregnancy: Secondary | ICD-10-CM | POA: Diagnosis not present

## 2015-09-11 DIAGNOSIS — A084 Viral intestinal infection, unspecified: Secondary | ICD-10-CM | POA: Diagnosis not present

## 2015-09-11 DIAGNOSIS — O9989 Other specified diseases and conditions complicating pregnancy, childbirth and the puerperium: Secondary | ICD-10-CM

## 2015-09-11 DIAGNOSIS — R197 Diarrhea, unspecified: Secondary | ICD-10-CM | POA: Diagnosis not present

## 2015-09-11 DIAGNOSIS — R112 Nausea with vomiting, unspecified: Secondary | ICD-10-CM | POA: Diagnosis present

## 2015-09-11 DIAGNOSIS — O26893 Other specified pregnancy related conditions, third trimester: Secondary | ICD-10-CM | POA: Diagnosis not present

## 2015-09-11 DIAGNOSIS — O212 Late vomiting of pregnancy: Secondary | ICD-10-CM | POA: Diagnosis not present

## 2015-09-11 LAB — URINALYSIS, ROUTINE W REFLEX MICROSCOPIC
Bilirubin Urine: NEGATIVE
GLUCOSE, UA: NEGATIVE mg/dL
Hgb urine dipstick: NEGATIVE
Ketones, ur: 40 mg/dL — AB
Leukocytes, UA: NEGATIVE
NITRITE: NEGATIVE
PROTEIN: NEGATIVE mg/dL
Specific Gravity, Urine: >1.03 — ABNORMAL HIGH (ref 1.005–1.030)
pH: 5.5 (ref 5.0–8.0)

## 2015-09-11 MED ORDER — PROMETHAZINE HCL 12.5 MG PO TABS: ORAL_TABLET | ORAL | Status: DC

## 2015-09-11 MED ORDER — SODIUM CHLORIDE 0.9 % IV SOLN
25.0000 mg | Freq: Once | INTRAVENOUS | Status: AC
  Administered 2015-09-11: 25 mg via INTRAVENOUS

## 2015-09-11 NOTE — MAU Note (Signed)
Pt reports nausea, vomiting and diarrhea all day today. Denies fever. Loose stools x 6 today.

## 2015-09-11 NOTE — MAU Note (Signed)
Pt states that she feels much better now after IV fluids

## 2015-09-11 NOTE — Discharge Instructions (Signed)
Viral Gastroenteritis Viral gastroenteritis is also known as stomach flu. This condition affects the stomach and intestinal tract. It can cause sudden diarrhea and vomiting. The illness typically lasts 3 to 8 days. Most people develop an immune response that eventually gets rid of the virus. While this natural response develops, the virus can make you quite ill. CAUSES  Many different viruses can cause gastroenteritis, such as rotavirus or noroviruses. You can catch one of these viruses by consuming contaminated food or water. You may also catch a virus by sharing utensils or other personal items with an infected person or by touching a contaminated surface. SYMPTOMS  The most common symptoms are diarrhea and vomiting. These problems can cause a severe loss of body fluids (dehydration) and a body salt (electrolyte) imbalance. Other symptoms may include:  Fever.  Headache.  Fatigue.  Abdominal pain. DIAGNOSIS  Your caregiver can usually diagnose viral gastroenteritis based on your symptoms and a physical exam. A stool sample may also be taken to test for the presence of viruses or other infections. TREATMENT  This illness typically goes away on its own. Treatments are aimed at rehydration. The most serious cases of viral gastroenteritis involve vomiting so severely that you are not able to keep fluids down. In these cases, fluids must be given through an intravenous line (IV). HOME CARE INSTRUCTIONS   Drink enough fluids to keep your urine clear or pale yellow. Drink small amounts of fluids frequently and increase the amounts as tolerated.  Ask your caregiver for specific rehydration instructions.  Avoid:  Foods high in sugar.  Alcohol.  Carbonated drinks.  Tobacco.  Juice.  Caffeine drinks.  Extremely hot or cold fluids.  Fatty, greasy foods.  Too much intake of anything at one time.  Dairy products until 24 to 48 hours after diarrhea stops.  You may consume probiotics.  Probiotics are active cultures of beneficial bacteria. They may lessen the amount and number of diarrheal stools in adults. Probiotics can be found in yogurt with active cultures and in supplements.  Wash your hands well to avoid spreading the virus.  Only take over-the-counter or prescription medicines for pain, discomfort, or fever as directed by your caregiver. Do not give aspirin to children. Antidiarrheal medicines are not recommended.  Ask your caregiver if you should continue to take your regular prescribed and over-the-counter medicines.  Keep all follow-up appointments as directed by your caregiver. SEEK IMMEDIATE MEDICAL CARE IF:   You are unable to keep fluids down.  You do not urinate at least once every 6 to 8 hours.  You develop shortness of breath.  You notice blood in your stool or vomit. This may look like coffee grounds.  You have abdominal pain that increases or is concentrated in one small area (localized).  You have persistent vomiting or diarrhea.  You have a fever.  The patient is a child younger than 3 months, and he or she has a fever.  The patient is a child older than 3 months, and he or she has a fever and persistent symptoms.  The patient is a child older than 3 months, and he or she has a fever and symptoms suddenly get worse.  The patient is a baby, and he or she has no tears when crying. MAKE SURE YOU:   Understand these instructions.  Will watch your condition.  Will get help right away if you are not doing well or get worse.   This information is not intended to replace  advice given to you by your health care provider. Make sure you discuss any questions you have with your health care provider. °  °Document Released: 07/20/2005 Document Revised: 10/12/2011 Document Reviewed: 05/06/2011 °Elsevier Interactive Patient Education ©2016 [employer] ° °Preterm Labor Information °Preterm labor is when labor starts at less than 37 weeks of pregnancy.  The normal length of a pregnancy is 39 to 41 weeks. °CAUSES °Often, there is no identifiable underlying cause as to why a woman goes into preterm labor. One of the most common known causes of preterm labor is infection. Infections of the uterus, cervix, vagina, amniotic sac, bladder, kidney, or even the lungs (pneumonia) can cause labor to start. Other suspected causes of preterm labor include:  °· Urogenital infections, such as yeast infections and bacterial vaginosis.   °· Uterine abnormalities (uterine shape, uterine septum, fibroids, or bleeding from the placenta).   °· A cervix that has been operated on (it may fail to stay closed).   °· Malformations in the fetus.   °· Multiple gestations (twins, triplets, and so on).   °· Breakage of the amniotic sac.   °RISK FACTORS °· Having a previous history of preterm labor.   °· Having premature rupture of membranes (PROM).   °· Having a placenta that covers the opening of the cervix (placenta previa).   °· Having a placenta that separates from the uterus (placental abruption).   °· Having a cervix that is too weak to hold the fetus in the uterus (incompetent cervix).   °· Having too much fluid in the amniotic sac (polyhydramnios).   °· Taking illegal drugs or smoking while pregnant.   °· Not gaining enough weight while pregnant.   °· Being younger than 18 and older than 33 years old.   °· Having a low socioeconomic status.   °· Being African American. °SYMPTOMS °Signs and symptoms of preterm labor include:  °· Menstrual-like cramps, abdominal pain, or back pain. °· Uterine contractions that are regular, as frequent as six in an hour, regardless of their intensity (may be mild or painful). °· Contractions that start on the top of the uterus and spread down to the lower abdomen and back.   °· A sense of increased pelvic pressure.   °· A watery or bloody mucus discharge that comes from the vagina.   °TREATMENT °Depending on the length of the pregnancy and other  circumstances, your health care provider may suggest bed rest. If necessary, there are medicines that can be given to stop contractions and to mature the fetal lungs. If labor happens before 34 weeks of pregnancy, a prolonged hospital stay may be recommended. Treatment depends on the condition of both you and the fetus.  °WHAT SHOULD YOU DO IF YOU THINK YOU ARE IN PRETERM LABOR? °Call your health care provider right away. You will need to go to the hospital to get checked immediately. °HOW CAN YOU PREVENT PRETERM LABOR IN FUTURE PREGNANCIES? °You should:  °· Stop smoking if you smoke.  °· Maintain healthy weight gain and avoid chemicals and drugs that are not necessary. °· Be watchful for any type of infection. °· Inform your health care provider if you have a known history of preterm labor. °  °This information is not intended to replace advice given to you by your health care provider. Make sure you discuss any questions you have with your health care provider. °  °Document Released: 10/10/2003 Document Revised: 03/22/2013 Document Reviewed: 08/22/2012 °Elsevier Interactive Patient Education ©2016 [employer] ° ° °

## 2016-05-13 ENCOUNTER — Encounter (HOSPITAL_COMMUNITY): Payer: Self-pay

## 2016-11-14 ENCOUNTER — Emergency Department (HOSPITAL_COMMUNITY): Payer: BLUE CROSS/BLUE SHIELD

## 2016-11-14 ENCOUNTER — Emergency Department (HOSPITAL_COMMUNITY)
Admission: EM | Admit: 2016-11-14 | Discharge: 2016-11-14 | Disposition: A | Discharge status: Home / Self Care | Payer: BLUE CROSS/BLUE SHIELD | Attending: Emergency Medicine | Admitting: Emergency Medicine

## 2016-11-14 ENCOUNTER — Encounter (HOSPITAL_COMMUNITY): Payer: Self-pay

## 2016-11-14 DIAGNOSIS — Z79899 Other long term (current) drug therapy: Secondary | ICD-10-CM | POA: Diagnosis not present

## 2016-11-14 DIAGNOSIS — R51 Headache: Secondary | ICD-10-CM | POA: Insufficient documentation

## 2016-11-14 DIAGNOSIS — R931 Abnormal findings on diagnostic imaging of heart and coronary circulation: Secondary | ICD-10-CM | POA: Insufficient documentation

## 2016-11-14 DIAGNOSIS — M545 Low back pain, unspecified: Secondary | ICD-10-CM

## 2016-11-14 DIAGNOSIS — Y999 Unspecified external cause status: Secondary | ICD-10-CM | POA: Insufficient documentation

## 2016-11-14 DIAGNOSIS — Y939 Activity, unspecified: Secondary | ICD-10-CM | POA: Diagnosis not present

## 2016-11-14 DIAGNOSIS — R933 Abnormal findings on diagnostic imaging of other parts of digestive tract: Secondary | ICD-10-CM | POA: Diagnosis not present

## 2016-11-14 DIAGNOSIS — S199XXA Unspecified injury of neck, initial encounter: Secondary | ICD-10-CM | POA: Diagnosis present

## 2016-11-14 DIAGNOSIS — Y92411 Interstate highway as the place of occurrence of the external cause: Secondary | ICD-10-CM | POA: Insufficient documentation

## 2016-11-14 DIAGNOSIS — S161XXA Strain of muscle, fascia and tendon at neck level, initial encounter: Secondary | ICD-10-CM | POA: Diagnosis not present

## 2016-11-14 LAB — I-STAT BETA HCG BLOOD, ED (MC, WL, AP ONLY): I-stat hCG, quantitative: <5 m[IU]/mL (ref <5)

## 2016-11-14 LAB — I-STAT CHEM 8, ED
BUN: 6 mg/dL (ref 6–20)
CALCIUM ION: 1.17 mmol/L (ref 1.15–1.40)
Chloride: 103 mmol/L (ref 101–111)
Creatinine, Ser: 0.7 mg/dL (ref 0.44–1.00)
Glucose, Bld: 101 mg/dL — ABNORMAL HIGH (ref 65–99)
HCT: 43 % (ref 36.0–46.0)
Hemoglobin: 14.6 g/dL (ref 12.0–15.0)
Potassium: 4 mmol/L (ref 3.5–5.1)
SODIUM: 141 mmol/L (ref 135–145)
TCO2: 28 mmol/L (ref 0–100)

## 2016-11-14 LAB — I-STAT CG4 LACTIC ACID, ED: Lactic Acid, Venous: 0.93 mmol/L (ref 0.5–1.9)

## 2016-11-14 MED ORDER — IOPAMIDOL (ISOVUE-300) INJECTION 61%
INTRAVENOUS | Status: DC
Start: 2016-11-14 — End: 2016-11-14
  Filled 2016-11-14: qty 100

## 2016-11-14 MED ORDER — CYCLOBENZAPRINE HCL 10 MG PO TABS: 5.0000 mg | ORAL_TABLET | Freq: Two times a day (BID) | ORAL | 0 refills | Status: DC | PRN

## 2016-11-14 MED ORDER — IBUPROFEN 800 MG PO TABS: 800.0000 mg | ORAL_TABLET | Freq: Three times a day (TID) | ORAL | 0 refills | Status: DC

## 2016-11-14 MED ORDER — IOPAMIDOL (ISOVUE-300) INJECTION 61%
100.0000 mL | Freq: Once | INTRAVENOUS | Status: AC | PRN
Start: 2016-11-14 — End: 2016-11-14
  Administered 2016-11-14: 100 mL via INTRAVENOUS

## 2016-11-14 MED ORDER — MORPHINE SULFATE (PF) 10 MG/ML IV SOLN
4.0000 mg | Freq: Once | INTRAVENOUS | Status: AC
  Administered 2016-11-14: 4 mg via INTRAVENOUS
  Filled 2016-11-14: qty 1

## 2016-11-14 MED ORDER — ONDANSETRON HCL 4 MG/2ML IJ SOLN
4.0000 mg | Freq: Once | INTRAMUSCULAR | Status: AC
  Administered 2016-11-14: 4 mg via INTRAVENOUS
  Filled 2016-11-14: qty 2
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
## 2016-11-14 NOTE — Discharge Instructions (Signed)
Your CT scan shows that your IUD may be in an incorrect in position. Please follow up with your OBGYN for further evaluation.  Use condoms to prevent pregnancy.
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
## 2017-04-15 ENCOUNTER — Encounter (HOSPITAL_COMMUNITY): Payer: Self-pay | Admitting: Emergency Medicine

## 2017-04-15 DIAGNOSIS — Z5321 Procedure and treatment not carried out due to patient leaving prior to being seen by health care provider: Secondary | ICD-10-CM | POA: Insufficient documentation

## 2017-04-15 DIAGNOSIS — R109 Unspecified abdominal pain: Secondary | ICD-10-CM | POA: Insufficient documentation

## 2017-04-15 LAB — CBC
HEMATOCRIT: 39 % (ref 36.0–46.0)
HEMOGLOBIN: 12.8 g/dL (ref 12.0–15.0)
MCH: 28.7 pg (ref 26.0–34.0)
MCHC: 32.8 g/dL (ref 30.0–36.0)
MCV: 87.4 fL (ref 78.0–100.0)
Platelets: 261 10*3/uL (ref 150–400)
RBC: 4.46 MIL/uL (ref 3.87–5.11)
RDW: 13.1 % (ref 11.5–15.5)
WBC: 7.5 10*3/uL (ref 4.0–10.5)

## 2017-04-15 LAB — COMPREHENSIVE METABOLIC PANEL
ALBUMIN: 3.8 g/dL (ref 3.5–5.0)
ALT: 14 U/L (ref 14–54)
ANION GAP: 7 (ref 5–15)
AST: 17 U/L (ref 15–41)
Alkaline Phosphatase: 100 U/L (ref 38–126)
BUN: 7 mg/dL (ref 6–20)
CHLORIDE: 107 mmol/L (ref 101–111)
CO2: 24 mmol/L (ref 22–32)
Calcium: 9 mg/dL (ref 8.9–10.3)
Creatinine, Ser: 0.71 mg/dL (ref 0.44–1.00)
GFR calc Af Amer: >60 mL/min (ref >60)
GFR calc non Af Amer: >60 mL/min (ref >60)
GLUCOSE: 96 mg/dL (ref 65–99)
POTASSIUM: 3.8 mmol/L (ref 3.5–5.1)
SODIUM: 138 mmol/L (ref 135–145)
Total Bilirubin: 0.4 mg/dL (ref 0.3–1.2)
Total Protein: 7.5 g/dL (ref 6.5–8.1)

## 2017-04-15 LAB — I-STAT CG4 LACTIC ACID, ED: Lactic Acid, Venous: 1.38 mmol/L (ref 0.5–1.9)

## 2017-04-15 LAB — URINALYSIS, ROUTINE W REFLEX MICROSCOPIC
Bilirubin Urine: NEGATIVE
Glucose, UA: NEGATIVE mg/dL
Ketones, ur: NEGATIVE mg/dL
Leukocytes, UA: NEGATIVE
NITRITE: NEGATIVE
PROTEIN: NEGATIVE mg/dL
Specific Gravity, Urine: 1.012 (ref 1.005–1.030)
pH: 8 (ref 5.0–8.0)

## 2017-04-15 LAB — HCG, QUANTITATIVE, PREGNANCY

## 2017-04-15 LAB — LIPASE, BLOOD: LIPASE: 26 U/L (ref 11–51)

## 2017-04-15 MED ORDER — OXYCODONE-ACETAMINOPHEN 5-325 MG PO TABS
1.0000 | ORAL_TABLET | Freq: Once | ORAL | Status: AC
  Administered 2017-04-15: 1 via ORAL

## 2017-04-15 MED ORDER — OXYCODONE-ACETAMINOPHEN 5-325 MG PO TABS
ORAL_TABLET | ORAL | Status: AC
  Filled 2017-04-15: qty 1

## 2017-04-15 MED ORDER — ONDANSETRON 4 MG PO TBDP
4.0000 mg | ORAL_TABLET | Freq: Once | ORAL | Status: AC | PRN
  Administered 2017-04-15: 4 mg via ORAL

## 2017-04-15 MED ORDER — ONDANSETRON 4 MG PO TBDP
ORAL_TABLET | ORAL | Status: AC
  Administered 2017-04-15: 4 mg via ORAL
  Filled 2017-04-15: qty 1

## 2017-04-15 NOTE — ED Notes (Signed)
Pt reports epigastric pain present "for a minute" Asked pt to explain what this meant and she states abd pain has been present for a week. Sharp in nature, N/V/D.

## 2017-04-16 ENCOUNTER — Emergency Department (HOSPITAL_COMMUNITY)
Admission: EM | Admit: 2017-04-16 | Discharge: 2017-04-16 | Disposition: A | Discharge status: Home / Self Care | Payer: BLUE CROSS/BLUE SHIELD | Attending: Emergency Medicine | Admitting: Emergency Medicine

## 2018-05-04 DIAGNOSIS — B009 Herpesviral infection, unspecified: Secondary | ICD-10-CM | POA: Insufficient documentation

## 2021-08-20 ENCOUNTER — Ambulatory Visit (HOSPITAL_BASED_OUTPATIENT_CLINIC_OR_DEPARTMENT_OTHER)
Admission: RE | Admit: 2021-08-20 | Discharge: 2021-08-20 | Disposition: A | Discharge status: Home / Self Care | Payer: No Typology Code available for payment source | Source: Ambulatory Visit | Attending: Orthopaedic Surgery | Admitting: Orthopaedic Surgery

## 2021-08-20 ENCOUNTER — Ambulatory Visit (INDEPENDENT_AMBULATORY_CARE_PROVIDER_SITE_OTHER): Payer: No Typology Code available for payment source | Admitting: Orthopaedic Surgery

## 2021-08-20 ENCOUNTER — Other Ambulatory Visit (HOSPITAL_BASED_OUTPATIENT_CLINIC_OR_DEPARTMENT_OTHER): Payer: Self-pay | Admitting: Orthopaedic Surgery

## 2021-08-20 ENCOUNTER — Other Ambulatory Visit: Payer: Self-pay

## 2021-08-20 DIAGNOSIS — M25562 Pain in left knee: Secondary | ICD-10-CM

## 2021-08-20 DIAGNOSIS — M2392 Unspecified internal derangement of left knee: Secondary | ICD-10-CM
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
## 2021-09-20 ENCOUNTER — Ambulatory Visit
Admission: RE | Admit: 2021-09-20 | Discharge: 2021-09-20 | Disposition: A | Discharge status: Home / Self Care | Payer: No Typology Code available for payment source | Source: Ambulatory Visit | Attending: Orthopaedic Surgery | Admitting: Orthopaedic Surgery

## 2021-09-20 ENCOUNTER — Other Ambulatory Visit: Payer: Self-pay

## 2021-09-20 ENCOUNTER — Other Ambulatory Visit: Payer: No Typology Code available for payment source

## 2021-09-20 DIAGNOSIS — M25562 Pain in left knee: Secondary | ICD-10-CM

## 2021-09-24 ENCOUNTER — Other Ambulatory Visit: Payer: Self-pay

## 2021-09-24 ENCOUNTER — Ambulatory Visit (INDEPENDENT_AMBULATORY_CARE_PROVIDER_SITE_OTHER): Payer: No Typology Code available for payment source | Admitting: Orthopaedic Surgery

## 2021-09-24 DIAGNOSIS — M25562 Pain in left knee: Secondary | ICD-10-CM

## 2021-09-24 DIAGNOSIS — S83242A Other tear of medial meniscus, current injury, left knee, initial encounter: Secondary | ICD-10-CM
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
## 2021-10-09 DIAGNOSIS — K1121 Acute sialoadenitis: Secondary | ICD-10-CM | POA: Insufficient documentation

## 2021-10-09 DIAGNOSIS — R059 Cough, unspecified: Secondary | ICD-10-CM | POA: Insufficient documentation

## 2021-12-07 ENCOUNTER — Encounter (HOSPITAL_COMMUNITY): Payer: Self-pay | Admitting: Emergency Medicine

## 2021-12-07 ENCOUNTER — Emergency Department (HOSPITAL_COMMUNITY)
Admission: EM | Admit: 2021-12-07 | Discharge: 2021-12-07 | Disposition: A | Discharge status: Home / Self Care | Payer: No Typology Code available for payment source | Attending: Emergency Medicine | Admitting: Emergency Medicine

## 2021-12-07 ENCOUNTER — Other Ambulatory Visit: Payer: Self-pay

## 2021-12-07 DIAGNOSIS — R040 Epistaxis: Secondary | ICD-10-CM | POA: Insufficient documentation

## 2021-12-07 MED ORDER — OXYMETAZOLINE HCL 0.05 % NA SOLN
1.0000 | Freq: Once | NASAL | Status: AC
  Administered 2021-12-07: 1 via NASAL
  Filled 2021-12-07: qty 30

## 2021-12-07 NOTE — ED Notes (Signed)
Checked on pt in lobby, no bleeding noted at this time ?

## 2021-12-07 NOTE — ED Triage Notes (Addendum)
Pt reports nosebleed x2 tonight, was able to control bleeding initially, current bleeding episode has lasted 30 min. Not on blood thinners. No h/o trauma to face.  ?Denies dizziness at this time.  ? ?

## 2021-12-07 NOTE — ED Provider Notes (Signed)
?[facility] EMERGENCY DEPARTMENT ?Provider Note ? ? ?CSN: [id number] ?Arrival date & time: 12/07/21  0149 ? ?  ? ?History ? ?Chief Complaint  ?Patient presents with  ? Epistaxis  ? ? ?Maria Grimes is a 41 y.o. female. ? ?Pt had a nose bleed yesterday.  Pt reports she used afrin and bleeding stopped  ? ?The history is provided by the patient. No language interpreter was used.  ?Epistaxis ?Location:  Bilateral ?Severity:  Moderate ?Timing:  Intermittent ?Progression:  Worsening ?Chronicity:  New ?Relieved by:  Nothing ?Worsened by:  Nothing ?Ineffective treatments:  None tried ?Associated symptoms: no fever   ? ?  ? ?Home Medications ?Prior to Admission medications   ?Medication Sig Start Date End Date Taking? Authorizing Provider  ?cyclobenzaprine (FLEXERIL) 10 MG tablet Take 0.5-1 tablets (5-10 mg total) by mouth 2 (two) times daily as needed for muscle spasms. 11/14/16   Tilden Fossa, MD  ?ibuprofen (ADVIL,MOTRIN) 800 MG tablet Take 1 tablet (800 mg total) by mouth 3 (three) times daily. 11/14/16   Tilden Fossa, MD  ?Prenatal Vit-Fe Fumarate-FA (PRENATAL MULTIVITAMIN) TABS Take 1 tablet by mouth every other day.     [provider]  ?promethazine (PHENERGAN) 12.5 MG tablet Take 1-2 tabs every 6 hours as needed for nausea/vomiting. 09/11/15   Heckart, Joice Lofts, MD  ?   ? ?Allergies    ?Patient has no known allergies.   ? ?Review of Systems   ?Review of Systems  ?Constitutional:  Negative for fever.  ?HENT:  Positive for nosebleeds.   ?All other systems reviewed and are negative. ? ?Physical Exam ?Updated Vital Signs ?BP (!) 131/119 (BP Location: Right Arm)   Pulse (!) 103   Temp 97.8 ?F (36.6 ?C) (Oral)   Resp 18   Wt 99.8 kg   SpO2 100%   BMI 38.97 kg/m?  ?Physical Exam ?Vitals and nursing note reviewed.  ?Constitutional:   ?   Appearance: She is well-developed.  ?HENT:  ?   Head: Normocephalic.  ?   Nose:  ?   Comments: Bulging nasal mucosa, no active bleeding  ?Pulmonary:  ?    Effort: Pulmonary effort is normal.  ?Abdominal:  ?   General: There is no distension.  ?Musculoskeletal:     ?   General: Normal range of motion.  ?   Cervical back: Normal range of motion.  ?Skin: ?   General: Skin is warm.  ?Neurological:  ?   General: No focal deficit present.  ?   Mental Status: She is alert and oriented to person, place, and time.  ?Psychiatric:     ?   Mood and Affect: Mood normal.  ? ? ?ED Results / Procedures / Treatments   ?Labs ?(all labs ordered are listed, but only abnormal results are displayed) ?Labs Reviewed - No data to display ? ?EKG ?None ? ?Radiology ?No results found. ? ?Procedures ?Procedures  ? ? ?Medications Ordered in ED ?Medications  ?oxymetazoline (AFRIN) 0.05 % nasal spray 1 spray (1 spray Each Nare Given 12/07/21 0232)  ? ? ?ED Course/ Medical Decision Making/ A&P ?  ?                        ?Medical Decision Making ?Risk ?OTC drugs. ? ? ?Pt counseled on nosebleed. Pt does not have any bruises, no concerns for bleeding disorder, no traumatic injury.  Pt has had some nasal congestion.  This seems to be a simple nose bleed ? ? ? ? ? ? ? ?  Final Clinical Impression(s) / ED Diagnoses ?Final diagnoses:  ?Epistaxis  ? ? ?Rx / DC Orders ?ED Discharge Orders   ? ? None  ? ?  ? ?An After Visit Summary was printed and given to the patient.  ?  ?Elson Areas, PA-C ?12/07/21 0715 ? ?  ?Maia Plan, MD ?12/09/21 1012 ? ?

## 2021-12-10 ENCOUNTER — Ambulatory Visit (INDEPENDENT_AMBULATORY_CARE_PROVIDER_SITE_OTHER): Payer: No Typology Code available for payment source

## 2021-12-10 ENCOUNTER — Ambulatory Visit (INDEPENDENT_AMBULATORY_CARE_PROVIDER_SITE_OTHER): Payer: No Typology Code available for payment source | Admitting: Orthopaedic Surgery

## 2021-12-10 DIAGNOSIS — G8929 Other chronic pain: Secondary | ICD-10-CM | POA: Diagnosis not present

## 2021-12-10 DIAGNOSIS — M25561 Pain in right knee: Secondary | ICD-10-CM | POA: Diagnosis not present

## 2021-12-10 DIAGNOSIS — M25562 Pain in left knee: Secondary | ICD-10-CM

## 2021-12-10 MED ORDER — LIDOCAINE HCL 1 % IJ SOLN
4.0000 mL | INTRAMUSCULAR | Status: AC | PRN
  Administered 2021-12-10: 4 mL

## 2021-12-10 MED ORDER — TRIAMCINOLONE ACETONIDE 40 MG/ML IJ SUSP
80.0000 mg | INTRAMUSCULAR | Status: AC | PRN
  Administered 2021-12-10: 80 mg via INTRA_ARTICULAR
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
## 2022-01-05 ENCOUNTER — Encounter (HOSPITAL_BASED_OUTPATIENT_CLINIC_OR_DEPARTMENT_OTHER): Payer: Self-pay | Admitting: Nurse Practitioner

## 2022-01-05 ENCOUNTER — Ambulatory Visit (INDEPENDENT_AMBULATORY_CARE_PROVIDER_SITE_OTHER): Payer: No Typology Code available for payment source | Admitting: Nurse Practitioner

## 2022-01-05 VITALS — BP 132/103 | HR 74 | Ht 65.0 in | Wt 224.4 lb

## 2022-01-05 DIAGNOSIS — Z1321 Encounter for screening for nutritional disorder: Secondary | ICD-10-CM

## 2022-01-05 DIAGNOSIS — Z8632 Personal history of gestational diabetes: Secondary | ICD-10-CM | POA: Diagnosis not present

## 2022-01-05 DIAGNOSIS — Z0000 Encounter for general adult medical examination without abnormal findings: Secondary | ICD-10-CM | POA: Diagnosis not present

## 2022-01-05 DIAGNOSIS — Z1329 Encounter for screening for other suspected endocrine disorder: Secondary | ICD-10-CM | POA: Diagnosis not present

## 2022-01-05 DIAGNOSIS — I10 Essential (primary) hypertension: Secondary | ICD-10-CM

## 2022-01-05 DIAGNOSIS — Z13228 Encounter for screening for other metabolic disorders: Secondary | ICD-10-CM

## 2022-01-05 DIAGNOSIS — Z130 Encounter for screening for diseases of the blood and blood-forming organs and certain disorders involving the immune mechanism: Secondary | ICD-10-CM

## 2022-01-05 DIAGNOSIS — R635 Abnormal weight gain: Secondary | ICD-10-CM
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
## 2022-01-05 NOTE — Patient Instructions (Addendum)
Thank you for choosing [place] at [facility] for your Primary Care needs. I am excited for the opportunity to partner with you to meet your health care goals. It was a pleasure meeting you today!  Recommendations from today's visit: I will see what your labs show and we will know what we can send in for you as far as medication goes.  I will be in touch with you after the labs come back and we will decide our next steps.    Information on diet, exercise, and health maintenance recommendations are listed below. This is information to help you be sure you are on track for optimal health and monitoring.   Please look over this and let us know if you have any questions or if you have completed any of the health maintenance outside of [place] so that we can be sure your records are up to date.  ___________________________________________________________ About Me: I am an Adult-Geriatric Nurse Practitioner with a background in caring for patients for more than 20 years with a strong intensive care background. I provide primary care and sports medicine services to patients age 79 and older within this office. My education had a strong focus on caring for the older adult population, which I am passionate about. I am also the director of the APP Fellowship with [facility].   My desire is to provide you with the best service through preventive medicine and supportive care. I consider you a part of the medical team and value your input. I work diligently to ensure that you are heard and your needs are met in a safe and effective manner. I want you to feel comfortable with me as your provider and want you to know that your health concerns are important to me.  For your information, our office hours are: Monday, Tuesday, and Thursday 8:00 AM - 5:00 PM Wednesday and Friday 8:00 AM - 12:00 PM.   In my time away from the office I am teaching new APP's within the system and am  unavailable, but my partner, Dr. Burnard Bunting is in the office for emergent needs.   If you have questions or concerns, please call our office at [phone] or send [place] a MyChart message and we will respond as quickly as possible.  ____________________________________________________________ MyChart:  For all urgent or time sensitive needs we ask that you please call the office to avoid delays. Our number is [phone]. MyChart is not constantly monitored and due to the large volume of messages a day, replies may take up to 72 business hours.  MyChart Policy: MyChart allows for you to see your visit notes, after visit summary, provider recommendations, lab and tests results, make an appointment, request refills, and contact your provider or the office for non-urgent questions or concerns. Providers are seeing patients during normal business hours and do not have built in time to review MyChart messages.  We ask that you allow a minimum of 3 business days for responses to [employer]. For this reason, please do not send urgent requests through [place]. Please call the office at [phone]. New and ongoing conditions may require a visit. We have virtual and in person visit available for your convenience.  Complex MyChart concerns may require a visit. Your provider may request you schedule a virtual or in person visit to ensure we are providing the best care possible. MyChart messages sent after 11:00 AM on Friday will not be received by the provider until Monday  morning.    Lab and Test Results: You will receive your lab and test results on MyChart as soon as they are completed and results have been sent by the lab or testing facility. Due to this service, you will receive your results BEFORE your provider.  I review lab and tests results each morning prior to seeing patients. Some results require collaboration with other providers to ensure you are receiving the most appropriate care. For this  reason, we ask that you please allow a minimum of 3-5 business days from the time the ALL results have been received for your provider to receive and review lab and test results and contact you about these.  Most lab and test result comments from the provider will be sent through [place]. Your provider may recommend changes to the plan of care, follow-up visits, repeat testing, ask questions, or request an office visit to discuss these results. You may reply directly to this message or call the office at [phone] to provide information for the provider or set up an appointment. In some instances, you will be called with test results and recommendations. Please let us know if this is preferred and we will make note of this in your chart to provide this for you.    If you have not heard a response to your lab or test results in 5 business days from all results returning to [place], please call the office to let us know. We ask that you please avoid calling prior to this time unless there is an emergent concern. Due to high call volumes, this can delay the resulting process.  After Hours: For all non-emergency after hours needs, please call the office at [phone] and select the option to reach the on-call provider service. On-call services are shared between multiple [place] offices and therefore it will not be possible to speak directly with your provider. On-call providers may provide medical advice and recommendations, but are unable to provide refills for maintenance medications.  For all emergency or urgent medical needs after normal business hours, we recommend that you seek care at the closest Urgent Care or Emergency Department to ensure appropriate treatment in a timely manner.  [facility] [place] has a 24 hour emergency room located on the ground floor for your convenience.   Urgent Concerns During the Business Day Providers are seeing patients from 8AM to [place] with a  busy schedule and are most often not able to respond to non-urgent calls until the end of the day or the next business day. If you should have URGENT concerns during the day, please call and speak to the nurse or schedule a same day appointment so that we can address your concern without delay.   Thank you, again, for choosing me as your health care partner. I appreciate your trust and look forward to learning more about you.   Worthy Keeler, DNP, AGNP-c ___________________________________________________________  Health Maintenance Recommendations Screening Testing Mammogram Every 1 -2 years based on history and risk factors Starting at age 16 Pap Smear Ages 21-39 every 3 years Ages 58-65 every 5 years with HPV testing More frequent testing may be required based on results and history Colon Cancer Screening Every 1-10 years based on test performed, risk factors, and history Starting at age 79 Bone Density Screening Every 2-10 years based on history Starting at age 66 for women Recommendations for men differ based on medication usage, history, and risk factors AAA Screening One time ultrasound Men 74-75  years old who have every smoked Lung Cancer Screening Low Dose Lung CT every 12 months Age 53-80 years with a 30 pack-year smoking history who still smoke or who have quit within the last 15 years  Screening Labs Routine  Labs: Complete Blood Count (CBC), Complete Metabolic Panel (CMP), Cholesterol (Lipid Panel) Every 6-12 months based on history and medications May be recommended more frequently based on current conditions or previous results Hemoglobin A1c Lab Every 3-12 months based on history and previous results Starting at age 52 or earlier with diagnosis of diabetes, high cholesterol, BMI >26, and/or risk factors Frequent monitoring for patients with diabetes to ensure blood sugar control Thyroid Panel (TSH w/ T3 & T4) Every 6 months based on history, symptoms, and risk  factors May be repeated more often if on medication HIV One time testing for all patients 30 and older May be repeated more frequently for patients with increased risk factors or exposure Hepatitis C One time testing for all patients 31 and older May be repeated more frequently for patients with increased risk factors or exposure Gonorrhea, Chlamydia Every 12 months for all sexually active persons 13-24 years Additional monitoring may be recommended for those who are considered high risk or who have symptoms PSA Men 40-42 years old with risk factors Additional screening may be recommended from age 18-69 based on risk factors, symptoms, and history  Vaccine Recommendations Tetanus Booster All adults every 10 years Flu Vaccine All patients 6 months and older every year COVID Vaccine All patients 12 years and older Initial dosing with booster May recommend additional booster based on age and health history HPV Vaccine 2 doses all patients age 20-26 Dosing may be considered for patients over 26 Shingles Vaccine (Shingrix) 2 doses all adults 27 years and older Pneumonia (Pneumovax 23) All adults 11 years and older May recommend earlier dosing based on health history Pneumonia (Prevnar 72) All adults 42 years and older Dosed 1 year after Pneumovax 23  Additional Screening, Testing, and Vaccinations may be recommended on an individualized basis based on family history, health history, risk factors, and/or exposure.  __________________________________________________________  Diet Recommendations for All Patients  I recommend that all patients maintain a diet low in saturated fats, carbohydrates, and cholesterol. While this can be challenging at first, it is not impossible and small changes can make big differences.  Things to try: Decreasing the amount of soda, sweet tea, and/or juice to one or less per day and replace with water While water is always the first choice, if you do  not like water you may consider adding a water additive without sugar to improve the taste other sugar free drinks Replace potatoes with a brightly colored vegetable at dinner Use healthy oils, such as canola oil or olive oil, instead of butter or hard margarine Limit your bread intake to two pieces or less a day Replace regular pasta with low carb pasta options Bake, broil, or grill foods instead of frying Monitor portion sizes  Eat smaller, more frequent meals throughout the day instead of large meals  An important thing to remember is, if you love foods that are not great for your health, you don't have to give them up completely. Instead, allow these foods to be a reward when you have done well. Allowing yourself to still have special treats every once in a while is a nice way to tell yourself thank you for working hard to keep yourself healthy.   Also remember that every day  is a new day. If you have a bad day and "fall off the wagon", you can still climb right back up and keep moving along on your journey!  We have resources available to help you!  Some websites that may be helpful include: www.http://carter.biz/  Www.VeryWellFit.com _____________________________________________________________  Activity Recommendations for All Patients  I recommend that all adults get at least 20 minutes of moderate physical activity that elevates your heart rate at least 5 days out of the week.  Some examples include: Walking or jogging at a pace that allows you to carry on a conversation Cycling (stationary bike or outdoors) Water aerobics Yoga Weight lifting Dancing If physical limitations prevent you from putting stress on your joints, exercise in a pool or seated in a chair are excellent options.  Do determine your MAXIMUM heart rate for activity: YOUR AGE - 220 = MAX HeartRate   Remember! Do not push yourself too hard.  Start slowly and build up your pace, speed, weight, time in exercise,  etc.  Allow your body to rest between exercise and get good sleep. You will need more water than normal when you are exerting yourself. Do not wait until you are thirsty to drink. Drink with a purpose of getting in at least 8, 8 ounce glasses of water a day plus more depending on how much you exercise and sweat.    If you begin to develop dizziness, chest pain, abdominal pain, jaw pain, shortness of breath, headache, vision changes, lightheadedness, or other concerning symptoms, stop the activity and allow your body to rest. If your symptoms are severe, seek emergency evaluation immediately. If your symptoms are concerning, but not severe, please let us know so that we can recommend further evaluation.

## 2022-01-06 DIAGNOSIS — I10 Essential (primary) hypertension: Secondary | ICD-10-CM | POA: Insufficient documentation

## 2022-01-06 LAB — COMPREHENSIVE METABOLIC PANEL
ALT: 36 IU/L — ABNORMAL HIGH (ref 0–32)
AST: 23 IU/L (ref 0–40)
Albumin/Globulin Ratio: 1.3 (ref 1.2–2.2)
Albumin: 4.2 g/dL (ref 3.8–4.8)
Alkaline Phosphatase: 92 IU/L (ref 44–121)
BUN/Creatinine Ratio: 9 (ref 9–23)
BUN: 7 mg/dL (ref 6–20)
Bilirubin Total: 0.3 mg/dL (ref 0.0–1.2)
CO2: 24 mmol/L (ref 20–29)
Calcium: 9.5 mg/dL (ref 8.7–10.2)
Chloride: 100 mmol/L (ref 96–106)
Creatinine, Ser: 0.74 mg/dL (ref 0.57–1.00)
Globulin, Total: 3.2 g/dL (ref 1.5–4.5)
Glucose: 83 mg/dL (ref 70–99)
Potassium: 4 mmol/L (ref 3.5–5.2)
Sodium: 138 mmol/L (ref 134–144)
Total Protein: 7.4 g/dL (ref 6.0–8.5)
eGFR: 105 mL/min/{1.73_m2} (ref >59)

## 2022-01-06 LAB — CBC WITH DIFFERENTIAL/PLATELET
Basophils Absolute: 0 10*3/uL (ref 0.0–0.2)
Basos: 1 %
EOS (ABSOLUTE): 0.1 10*3/uL (ref 0.0–0.4)
Eos: 2 %
Hematocrit: 38 % (ref 34.0–46.6)
Hemoglobin: 12.5 g/dL (ref 11.1–15.9)
Immature Grans (Abs): 0 10*3/uL (ref 0.0–0.1)
Immature Granulocytes: 0 %
Lymphocytes Absolute: 2.6 10*3/uL (ref 0.7–3.1)
Lymphs: 43 %
MCH: 30.2 pg (ref 26.6–33.0)
MCHC: 32.9 g/dL (ref 31.5–35.7)
MCV: 92 fL (ref 79–97)
Monocytes Absolute: 0.5 10*3/uL (ref 0.1–0.9)
Monocytes: 8 %
Neutrophils Absolute: 2.8 10*3/uL (ref 1.4–7.0)
Neutrophils: 46 %
Platelets: 232 10*3/uL (ref 150–450)
RBC: 4.14 x10E6/uL (ref 3.77–5.28)
RDW: 12.6 % (ref 11.7–15.4)
WBC: 6 10*3/uL (ref 3.4–10.8)

## 2022-01-06 LAB — TSH: TSH: 1.07 u[IU]/mL (ref 0.450–4.500)

## 2022-01-06 LAB — HEMOGLOBIN A1C
Est. average glucose Bld gHb Est-mCnc: 100 mg/dL
Hgb A1c MFr Bld: 5.1 % (ref 4.8–5.6)

## 2022-01-06 LAB — VITAMIN D 25 HYDROXY (VIT D DEFICIENCY, FRACTURES): Vit D, 25-Hydroxy: 8.2 ng/mL — ABNORMAL LOW (ref 30.0–100.0)

## 2022-01-06 NOTE — Assessment & Plan Note (Signed)
Patient endorses concern over increase weight gain despite significant diet and exercise changes over the past several months. BMI today is 37.34. She also has a history of gestational diabetes and current HTN.  Will obtain labs today to rule out underlying etiology that could be contributing to her concern as well as establish medical management that may be most appropriate for her.  We will plan to follow-up once labs have returned for further recommendations.

## 2022-01-06 NOTE — Assessment & Plan Note (Addendum)
Chronic.  Initially elevated during visit today.  Patient will continue to monitor at home and report readings above 130/85.  May need to consider changing medication dosage for improved control.  We will refill current medications at this time.  Working on a plan to help with weight loss which should also improve control of her hypertension.  No alarm symptoms present at this time.  We will monitor labs today

## 2022-01-14 ENCOUNTER — Other Ambulatory Visit (HOSPITAL_BASED_OUTPATIENT_CLINIC_OR_DEPARTMENT_OTHER): Payer: Self-pay

## 2022-01-14 DIAGNOSIS — E559 Vitamin D deficiency, unspecified: Secondary | ICD-10-CM

## 2022-01-14 MED ORDER — VITAMIN D (ERGOCALCIFEROL) 1.25 MG (50000 UNIT) PO CAPS
50000.0000 [IU] | ORAL_CAPSULE | ORAL | 0 refills | Status: DC
  Filled 2022-01-14: qty 12, 84d supply, fill #0

## 2022-01-20 ENCOUNTER — Ambulatory Visit (HOSPITAL_BASED_OUTPATIENT_CLINIC_OR_DEPARTMENT_OTHER): Payer: No Typology Code available for payment source | Admitting: Nurse Practitioner

## 2022-04-09 ENCOUNTER — Encounter (HOSPITAL_BASED_OUTPATIENT_CLINIC_OR_DEPARTMENT_OTHER): Payer: Self-pay | Admitting: Nurse Practitioner

## 2022-04-09 ENCOUNTER — Other Ambulatory Visit (HOSPITAL_BASED_OUTPATIENT_CLINIC_OR_DEPARTMENT_OTHER): Payer: Self-pay

## 2022-04-09 ENCOUNTER — Ambulatory Visit (INDEPENDENT_AMBULATORY_CARE_PROVIDER_SITE_OTHER): Payer: No Typology Code available for payment source | Admitting: Nurse Practitioner

## 2022-04-09 VITALS — BP 130/100 | HR 74 | Ht 65.0 in | Wt 215.0 lb

## 2022-04-09 DIAGNOSIS — J020 Streptococcal pharyngitis: Secondary | ICD-10-CM

## 2022-04-09 LAB — POCT RAPID STREP A (OFFICE): Rapid Strep A Screen: POSITIVE — AB

## 2022-04-09 LAB — POCT INFLUENZA A/B
Influenza A, POC: NEGATIVE
Influenza B, POC: NEGATIVE

## 2022-04-09 MED ORDER — AMOXICILLIN-POT CLAVULANATE 875-125 MG PO TABS
1.0000 | ORAL_TABLET | Freq: Two times a day (BID) | ORAL | 0 refills | Status: DC
  Filled 2022-04-09: qty 10, 5d supply, fill #0

## 2022-04-13 ENCOUNTER — Other Ambulatory Visit (HOSPITAL_BASED_OUTPATIENT_CLINIC_OR_DEPARTMENT_OTHER): Payer: Self-pay

## 2022-04-13 ENCOUNTER — Encounter (HOSPITAL_BASED_OUTPATIENT_CLINIC_OR_DEPARTMENT_OTHER): Payer: Self-pay | Admitting: Nurse Practitioner

## 2022-04-13 DIAGNOSIS — B379 Candidiasis, unspecified: Secondary | ICD-10-CM

## 2022-04-13 MED ORDER — FLUCONAZOLE 150 MG PO TABS: 150.0000 mg | ORAL_TABLET | Freq: Once | ORAL | 1 refills | Status: AC

## 2022-04-14 ENCOUNTER — Encounter (HOSPITAL_BASED_OUTPATIENT_CLINIC_OR_DEPARTMENT_OTHER): Payer: Self-pay | Admitting: Nurse Practitioner

## 2022-04-14 NOTE — Patient Instructions (Signed)
Throat culture positive for group A beta-hemolytic strep -  patient  notified. Instructed to continue with antibiotics as directed and call if the patient is not improving.

## 2022-04-14 NOTE — Progress Notes (Signed)
  HPI and ROS  Maria Grimes presents today for Nurse Visit for evaluation of sore throat. Current symptoms include: Sore throat fatigue  Symptoms first started several days ago. Symptoms have been getting worse since they started Pain is burning  Symptoms improve with nothing Symptoms worsen with talking she tried OTCs without relief of pain  Similar symptoms have not occurred in the past Previous diagnosis was N/A   ASSESSMENT and PLAN Patient appears in no apparent distress, well developed and well nourished, in no respiratory distress and acyanotic, alert, oriented times 3, cooperative, and moderately ill  Orders placed under Standing Orders protocol and/or by direct guidance by provider: Orders Placed This Encounter  Procedures   POCT rapid strep A   POCT Influenza A/B    Patient provided with education and instructions on follow-up, result retrieval, and condition specific information.   All findings and Information obtained in visit routed to Shawna Clamp, DNP  CMA: Elbert Ewings, CMA  Positive POC strep test in the office today with significant symptoms. Will send antibiotic treatments for her to the pharmacy. Recommend follow-up if no improvement of symptoms in the next 5 days.   Tollie Eth, NP 04/14/2022

## 2022-05-18 ENCOUNTER — Encounter (HOSPITAL_BASED_OUTPATIENT_CLINIC_OR_DEPARTMENT_OTHER): Payer: Self-pay | Admitting: Nurse Practitioner

## 2022-05-18 ENCOUNTER — Other Ambulatory Visit (HOSPITAL_BASED_OUTPATIENT_CLINIC_OR_DEPARTMENT_OTHER): Payer: Self-pay | Admitting: Nurse Practitioner

## 2022-05-18 ENCOUNTER — Ambulatory Visit (INDEPENDENT_AMBULATORY_CARE_PROVIDER_SITE_OTHER): Payer: No Typology Code available for payment source | Admitting: Nurse Practitioner

## 2022-05-18 ENCOUNTER — Other Ambulatory Visit (HOSPITAL_BASED_OUTPATIENT_CLINIC_OR_DEPARTMENT_OTHER): Payer: Self-pay

## 2022-05-18 VITALS — BP 132/103 | HR 74 | Ht 62.0 in | Wt 224.0 lb

## 2022-05-18 DIAGNOSIS — N390 Urinary tract infection, site not specified: Secondary | ICD-10-CM

## 2022-05-18 DIAGNOSIS — E559 Vitamin D deficiency, unspecified: Secondary | ICD-10-CM

## 2022-05-18 LAB — POCT URINALYSIS DIP (CLINITEK)
Bilirubin, UA: NEGATIVE
Glucose, UA: NEGATIVE mg/dL
Ketones, POC UA: NEGATIVE mg/dL
Nitrite, UA: NEGATIVE
Spec Grav, UA: >=1.03 — AB (ref 1.010–1.025)
Urobilinogen, UA: 0.2 E.U./dL
pH, UA: 6 (ref 5.0–8.0)

## 2022-05-18 MED ORDER — VITAMIN D (ERGOCALCIFEROL) 1.25 MG (50000 UNIT) PO CAPS
50000.0000 [IU] | ORAL_CAPSULE | ORAL | 0 refills | Status: DC
  Filled 2022-05-18: qty 12, 84d supply, fill #0

## 2022-05-18 MED ORDER — NITROFURANTOIN MONOHYD MACRO 100 MG PO CAPS: 100.0000 mg | ORAL_CAPSULE | Freq: Two times a day (BID) | ORAL | 0 refills | Status: DC

## 2022-05-18 MED ORDER — AMLODIPINE BESYLATE 2.5 MG PO TABS
2.5000 mg | ORAL_TABLET | Freq: Every day | ORAL | 3 refills | Status: DC
  Filled 2022-05-18: qty 30, 30d supply, fill #0

## 2022-05-18 NOTE — Progress Notes (Signed)
Maria Grimes complains of pain for x 1 day. Patient reports no antibiotic use or no recent catheterization. Patient hasnt taken Azo. Denies flank pain, pelvic pain, fever, chills or sweats.

## 2022-05-22 NOTE — Addendum Note (Signed)
Addended by: Renwick Asman E on: 05/22/2022 06:01 PM   Modules accepted: Level of Service  

## 2022-07-22 ENCOUNTER — Encounter (INDEPENDENT_AMBULATORY_CARE_PROVIDER_SITE_OTHER): Payer: Self-pay | Admitting: Family Medicine

## 2022-07-22 ENCOUNTER — Ambulatory Visit (INDEPENDENT_AMBULATORY_CARE_PROVIDER_SITE_OTHER): Payer: No Typology Code available for payment source | Admitting: Family Medicine

## 2022-07-22 VITALS — BP 143/85 | HR 81 | Temp 98.3°F | Ht 63.0 in | Wt 227.0 lb

## 2022-07-22 DIAGNOSIS — E559 Vitamin D deficiency, unspecified: Secondary | ICD-10-CM | POA: Diagnosis not present

## 2022-07-22 DIAGNOSIS — I10 Essential (primary) hypertension: Secondary | ICD-10-CM

## 2022-07-22 DIAGNOSIS — Z6841 Body Mass Index (BMI) 40.0 and over, adult: Secondary | ICD-10-CM

## 2022-07-28 DIAGNOSIS — Z0289 Encounter for other administrative examinations: Secondary | ICD-10-CM

## 2022-08-11 ENCOUNTER — Telehealth (INDEPENDENT_AMBULATORY_CARE_PROVIDER_SITE_OTHER): Payer: Self-pay | Admitting: Family Medicine

## 2022-08-11 ENCOUNTER — Encounter (INDEPENDENT_AMBULATORY_CARE_PROVIDER_SITE_OTHER): Payer: Self-pay | Admitting: Family Medicine

## 2022-08-11 ENCOUNTER — Ambulatory Visit (INDEPENDENT_AMBULATORY_CARE_PROVIDER_SITE_OTHER): Payer: Medicaid Other | Admitting: Family Medicine

## 2022-08-11 VITALS — BP 120/88 | HR 74 | Temp 98.3°F | Ht 63.0 in | Wt 229.4 lb

## 2022-08-11 DIAGNOSIS — I10 Essential (primary) hypertension: Secondary | ICD-10-CM | POA: Diagnosis not present

## 2022-08-11 DIAGNOSIS — E559 Vitamin D deficiency, unspecified: Secondary | ICD-10-CM

## 2022-08-11 DIAGNOSIS — E669 Obesity, unspecified: Secondary | ICD-10-CM

## 2022-08-11 DIAGNOSIS — O24419 Gestational diabetes mellitus in pregnancy, unspecified control: Secondary | ICD-10-CM | POA: Insufficient documentation

## 2022-08-11 DIAGNOSIS — R5383 Other fatigue: Secondary | ICD-10-CM | POA: Diagnosis not present

## 2022-08-11 DIAGNOSIS — F5089 Other specified eating disorder: Secondary | ICD-10-CM

## 2022-08-11 DIAGNOSIS — Z1331 Encounter for screening for depression: Secondary | ICD-10-CM | POA: Diagnosis not present

## 2022-08-11 DIAGNOSIS — R0602 Shortness of breath: Secondary | ICD-10-CM

## 2022-08-11 DIAGNOSIS — F509 Eating disorder, unspecified: Secondary | ICD-10-CM | POA: Insufficient documentation

## 2022-08-11 DIAGNOSIS — Z6841 Body Mass Index (BMI) 40.0 and over, adult: Secondary | ICD-10-CM

## 2022-08-11 NOTE — Progress Notes (Signed)
Office: [phone]  /  Fax: [phone]   Initial Visit  Maria Grimes was seen in clinic today to evaluate for obesity. She is interested in losing weight to improve overall health and reduce the risk of weight related complications. She presents today to review program treatment options, initial physical assessment, and evaluation.     She was referred by: PCP  When asked what else they would like to accomplish? She states: Improve existing medical conditions  When asked how has your weight affected you? She states: Contributed to medical problems  Some associated conditions: Hypertension and Other: history of gestational diabetes mellitus  Contributing factors: Family history and Pregnancy  Current nutrition plan: Portion control / smart choices   Past medical history includes:   Past Medical History:  Diagnosis Date   Abnormal Pap smear    Constipation, chronic    Hypertension    Vitamin D deficiency      Objective:   BP (!) 143/85   Pulse 81   Temp 98.3 F (36.8 C)   Ht 5\' 3"  (1.6 m)   Wt 227 lb (103 kg)   SpO2 98%   BMI 40.21 kg/m  She was weighed on the bioimpedance scale: Body mass index is 40.21 kg/m. Visceral Fat Rating:13, Body Fat%:48.4.  General:  Alert, oriented and cooperative. Patient is in no acute distress.  Respiratory: Normal respiratory effort, no problems with respiration noted  Extremities: Normal range of motion.    Mental Status: Normal mood and affect. Normal behavior. Normal judgment and thought content.   Assessment and Plan:  1. Primary hypertension Maria Grimes's blood pressure is elevated today.  Her goal is to control this without the need for medication.  Plan: Patient is to start her eating plan and check her blood pressure at home.  We will recheck her blood pressure in 2 to 3 weeks.  2. Vitamin D deficiency Maria Grimes's last vitamin D level was very low.  She is on vitamin D prescription, and she notes  fatigue.  Plan: We will recheck her vitamin D level at her next visit, and will reassess effectiveness of vitamin D prescription.  3. Class 3 severe obesity due to excess calories with serious comorbidity and body mass index (BMI) of 40.0 to 44.9 in adult Maria Grimes) We reviewed weight, biometrics, associated medical conditions and contributing factors with patient. She would benefit from weight loss therapy via a modified calorie, low-carb, high-protein nutritional plan tailored to their REE (resting energy expenditure) which will be determined by indirect calorimetry.  We will also assess for cardiometabolic risk and nutritional derangements via fasting serologies at her next appointment.      Obesity Treatment / Action Plan:  Will complete provided nutritional and psychosocial assessment questionnaire before the next appointment. Will be scheduled for indirect calorimetry to determine resting energy expenditure in a fasting state.  This will allow [place] to create a reduced calorie, high-protein meal plan to promote loss of fat mass while preserving muscle mass. Was counseled on nutritional approaches to weight loss and benefits of complex carbs and high quality protein as part of nutritional weight management. Was counseled on pharmacotherapy and role as an adjunct in weight management.   Obesity Education Performed Today:  She was weighed on the bioimpedance scale and results were discussed and documented in the synopsis.  We discussed obesity as a disease and the importance of a more detailed evaluation of all the factors contributing to the disease.  We discussed the importance of long term  lifestyle changes which include nutrition, exercise and behavioral modifications as well as the importance of customizing this to her specific health and social needs.  We discussed the benefits of reaching a healthier weight to alleviate the symptoms of existing conditions and reduce the risks of the  biomechanical, metabolic and psychological effects of obesity.  Maria Grimes appears to be in the action stage of change and states they are ready to start intensive lifestyle modifications and behavioral modifications.  35 minutes was spent today on this visit including the above counseling, pre-visit chart review, and post-visit documentation.  Reviewed by clinician on day of visit: allergies, medications, problem list, medical history, surgical history, family history, social history, and previous encounter notes.   I, Burt Grimes, am acting as transcriptionist for Maria Quince, MD.   I have reviewed the above documentation for accuracy and completeness, and I agree with the above. Maria Quince, MD

## 2022-08-12 LAB — LIPID PANEL WITH LDL/HDL RATIO
Cholesterol, Total: 161 mg/dL (ref 100–199)
HDL: 46 mg/dL (ref >39)
LDL Chol Calc (NIH): 103 mg/dL — ABNORMAL HIGH (ref 0–99)
LDL/HDL Ratio: 2.2 ratio (ref 0.0–3.2)
Triglycerides: 59 mg/dL (ref 0–149)
VLDL Cholesterol Cal: 12 mg/dL (ref 5–40)

## 2022-08-12 LAB — COMPREHENSIVE METABOLIC PANEL
ALT: 20 IU/L (ref 0–32)
AST: 17 IU/L (ref 0–40)
Albumin/Globulin Ratio: 1.4 (ref 1.2–2.2)
Albumin: 4.1 g/dL (ref 3.9–4.9)
Alkaline Phosphatase: 112 IU/L (ref 44–121)
BUN/Creatinine Ratio: 9 (ref 9–23)
BUN: 7 mg/dL (ref 6–20)
Bilirubin Total: 0.3 mg/dL (ref 0.0–1.2)
CO2: 21 mmol/L (ref 20–29)
Calcium: 9.1 mg/dL (ref 8.7–10.2)
Chloride: 105 mmol/L (ref 96–106)
Creatinine, Ser: 0.75 mg/dL (ref 0.57–1.00)
Globulin, Total: 3 g/dL (ref 1.5–4.5)
Glucose: 88 mg/dL (ref 70–99)
Potassium: 4.5 mmol/L (ref 3.5–5.2)
Sodium: 140 mmol/L (ref 134–144)
Total Protein: 7.1 g/dL (ref 6.0–8.5)
eGFR: 104 mL/min/{1.73_m2} (ref >59)

## 2022-08-12 LAB — CBC WITH DIFFERENTIAL/PLATELET
Basophils Absolute: 0 10*3/uL (ref 0.0–0.2)
Basos: 0 %
EOS (ABSOLUTE): 0.1 10*3/uL (ref 0.0–0.4)
Eos: 1 %
Hematocrit: 40.2 % (ref 34.0–46.6)
Hemoglobin: 12.9 g/dL (ref 11.1–15.9)
Immature Grans (Abs): 0 10*3/uL (ref 0.0–0.1)
Immature Granulocytes: 0 %
Lymphocytes Absolute: 2.1 10*3/uL (ref 0.7–3.1)
Lymphs: 34 %
MCH: 28.9 pg (ref 26.6–33.0)
MCHC: 32.1 g/dL (ref 31.5–35.7)
MCV: 90 fL (ref 79–97)
Monocytes Absolute: 0.3 10*3/uL (ref 0.1–0.9)
Monocytes: 5 %
Neutrophils Absolute: 3.6 10*3/uL (ref 1.4–7.0)
Neutrophils: 60 %
Platelets: 258 10*3/uL (ref 150–450)
RBC: 4.47 x10E6/uL (ref 3.77–5.28)
RDW: 12.8 % (ref 11.7–15.4)
WBC: 6.1 10*3/uL (ref 3.4–10.8)

## 2022-08-12 LAB — TSH: TSH: 0.949 u[IU]/mL (ref 0.450–4.500)

## 2022-08-12 LAB — VITAMIN B12: Vitamin B-12: 296 pg/mL (ref 232–1245)

## 2022-08-12 LAB — INSULIN, RANDOM: INSULIN: 8.6 u[IU]/mL (ref 2.6–24.9)

## 2022-08-12 LAB — T4, FREE: Free T4: 1.23 ng/dL (ref 0.82–1.77)

## 2022-08-12 LAB — HEMOGLOBIN A1C
Est. average glucose Bld gHb Est-mCnc: 111 mg/dL
Hgb A1c MFr Bld: 5.5 % (ref 4.8–5.6)

## 2022-08-12 LAB — VITAMIN D 25 HYDROXY (VIT D DEFICIENCY, FRACTURES): Vit D, 25-Hydroxy: 19.3 ng/mL — ABNORMAL LOW (ref 30.0–100.0)

## 2022-08-25 ENCOUNTER — Encounter (INDEPENDENT_AMBULATORY_CARE_PROVIDER_SITE_OTHER): Payer: Self-pay | Admitting: Family Medicine

## 2022-08-25 ENCOUNTER — Ambulatory Visit (INDEPENDENT_AMBULATORY_CARE_PROVIDER_SITE_OTHER): Payer: Medicaid Other | Admitting: Family Medicine

## 2022-08-25 VITALS — BP 145/84 | HR 78 | Temp 97.9°F | Ht 63.0 in | Wt 223.2 lb

## 2022-08-25 DIAGNOSIS — E7849 Other hyperlipidemia: Secondary | ICD-10-CM

## 2022-08-25 DIAGNOSIS — E88819 Insulin resistance, unspecified: Secondary | ICD-10-CM

## 2022-08-25 DIAGNOSIS — I10 Essential (primary) hypertension: Secondary | ICD-10-CM | POA: Diagnosis not present

## 2022-08-25 DIAGNOSIS — E669 Obesity, unspecified: Secondary | ICD-10-CM

## 2022-08-25 DIAGNOSIS — Z6839 Body mass index (BMI) 39.0-39.9, adult: Secondary | ICD-10-CM

## 2022-08-25 DIAGNOSIS — E538 Deficiency of other specified B group vitamins: Secondary | ICD-10-CM

## 2022-08-25 DIAGNOSIS — E559 Vitamin D deficiency, unspecified: Secondary | ICD-10-CM

## 2022-08-25 MED ORDER — CYANOCOBALAMIN 500 MCG PO TABS: 500.0000 ug | ORAL_TABLET | Freq: Every day | ORAL | Status: DC

## 2022-08-25 MED ORDER — VITAMIN D (ERGOCALCIFEROL) 1.25 MG (50000 UNIT) PO CAPS: 50000.0000 [IU] | ORAL_CAPSULE | ORAL | 0 refills | Status: DC

## 2022-09-03 NOTE — Progress Notes (Signed)
Chief Complaint:   OBESITY Maria Grimes (MR# [id number]) is a 42 y.o. female who presents for evaluation and treatment of obesity and related comorbidities. Current BMI is 40.6. Maria Grimes has been struggling with her weight for many years and has been unsuccessful in either losing weight, maintaining weight loss, or reaching her healthy weight goal.  First office visit for information session was with Dr. Leafy Ro on 07/22/2022. She is here for a new patient office visit today and work-up. 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 She desires a 70 lb weight loss in 6 1/2 months. Never been on a diet. Craves pizza and carbohydrates. Snacks on chips, crackers, dips and cheese. Worst habit si snacking while working 2 jobs.   Maria Grimes is currently in the action stage of change and ready to dedicate time achieving and maintaining a healthier weight. Maria Grimes is interested in becoming our patient and working on intensive lifestyle modifications including (but not limited to) diet and exercise for weight loss.  Maria Grimes's habits were reviewed today and are as follows: Her family eats meals together, she thinks her family will eat healthier with her, her desired weight loss is 69 lbs, she started gaining weight in nursing school, her heaviest weight ever was 170 pounds, she is a picky eater and doesn't like to eat healthier foods, she has significant food cravings issues, she snacks frequently in the evenings, she skips meals frequently, she is frequently drinking liquids with calories, she frequently makes poor food choices, she has problems with excessive hunger, she frequently eats larger portions than normal, and she struggles with emotional eating.  Depression Screen Maria Grimes's Food and Mood (modified PHQ-9) score was 13.  Subjective:   1. Other fatigue Maria Grimes admits to daytime somnolence and admits to  waking up still tired. Patient has a history of symptoms of daytime fatigue, morning fatigue, and morning headache. Maria Grimes generally gets 7 hours of sleep per night, and states that she has nightime awakenings and generally restful sleep. Snoring is present. Apneic episodes are present. Epworth Sleepiness Score is 7.   2. SOB (shortness of breath) Maria Grimes notes increasing shortness of breath with exercising and seems to be worsening over time with weight gain. She notes getting out of breath sooner with activity than she used to. This has not gotten worse recently. Maria Grimes denies shortness of breath at rest or orthopnea.  3. Essential hypertension One year ago, went on HCTZ and Norvasc blood pressure medications. Patient is not checking blood pressure at home and currently not taking her blood pressure medications.   4. Vitamin D deficiency Maria Grimes has been on Ergo weekly for 1-2 months now, and since Nov on just multivitamins with Vitamin D. She notes fatigue.   5. Gestational diabetes mellitus (GDM), antepartum, gestational diabetes method of control unspecified Diagnosed with gestational diabetes mellitus with first pregnancy, and had pre-diabetes with second pregnancy per patient. She eats simple carbohydrates.   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 Family does a lot of celebration eating.   Assessment/Plan:   Orders Placed This Encounter  Procedures   CBC with Differential/Platelet   Comprehensive metabolic panel   Hemoglobin A1c   Insulin, random   Lipid Panel With LDL/HDL Ratio   T4, free   TSH   Vitamin B12   VITAMIN D 25 Hydroxy (Vit-D Deficiency,  Fractures)   EKG 12-Lead    There are no discontinued medications.   No orders of the defined types were placed in this encounter.    1. Other fatigue Maria Grimes does feel that her weight is causing her energy to be lower than it should be. Fatigue may be  related to obesity, depression or many other causes. Labs will be ordered, and in the meanwhile, Maria Grimes will focus on self care including making healthy food choices, increasing physical activity and focusing on stress reduction. We will check labs today. EKG was done today, rate 7075 BPM and no acute findings. Normal sinus rhythm.   - EKG 12-Lead - Vitamin B12  2. SOB (shortness of breath) Maria Grimes does feel that she gets out of breath more easily that she used to when she exercises. Maria Grimes's shortness of breath appears to be obesity related and exercise induced. She has agreed to work on weight loss and gradually increase exercise to treat her exercise induced shortness of breath. Will continue to monitor closely. IC done and measure slightly lower than calculated at 1684 and 1627 respectively.   3. Essential hypertension We will check labs today. Patient will work on her prudent nutritional plan and weight loss. Decrease salt and take medications per PCP as written. Blood pressure not at goal and counseling was done.   - CBC with Differential/Platelet - Comprehensive metabolic panel - Lipid Panel With LDL/HDL Ratio - T4, free - TSH  4. Vitamin D deficiency We will check labs today. Continue multivitamins. We will follow up on labs and discuss at her next office visit.    - VITAMIN D 25 Hydroxy (Vit-D Deficiency, Fractures)  5. Gestational diabetes mellitus (GDM), antepartum, gestational diabetes method of control unspecified We will check labs today. Patient will start her prudent nutritional plan frequently today and weight loss. We will continue to follow.   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 Set-up an appointment with Dr. Mallie Mussel, our Bariatric Psychologist for evaluation.   7. Depression screening Maria Grimes had a positive depression screening. Depression is commonly  associated with obesity and often results in emotional eating behaviors. We will monitor this closely and work on CBT to help improve the non-hunger eating patterns. Referral to Psychology may be required if no improvement is seen as she continues in our clinic.  8. Obesity with vurrent BMI 40.6 Maria Grimes is currently in the action stage of change and her goal is to continue with weight loss efforts. I recommend Maria Grimes begin the structured treatment plan as follows:  She has agreed to the Category 2 Plan with only 100 snack calories.  Work on prudent nutritional plan, don't skip meals, and set boundaries at work.   Exercise goals: As is.    Behavioral modification strategies: increasing lean protein intake and no skipping meals.  She was informed of the importance of frequent follow-up visits to maximize her success with intensive lifestyle modifications for her multiple health conditions. She was informed we would discuss her lab results at her next visit unless there is a critical issue that needs to be addressed sooner. Maria Grimes agreed to keep her next visit at the agreed upon time to discuss these results.  Objective:   Blood pressure 120/88, pulse 74, temperature 98.3 F (36.8 C), height 5' 3"$  (1.6 m), weight 229 lb 6.4 oz (104.1 kg), SpO2 100 %. Body mass index is 40.64 kg/m.  EKG: Normal sinus rhythm, rate 74 BPM.  Indirect Calorimeter completed today shows a VO2 of 236 and a REE of 1627.  Her calculated basal metabolic rate is [id number] thus her basal metabolic rate is worse than expected.  General: Cooperative, alert, well developed, in no acute distress. HEENT: Conjunctivae and lids unremarkable. Cardiovascular: Regular rhythm.  Lungs: Normal work of breathing. Neurologic: No focal deficits.   Lab Results  Component Value Date   CREATININE 0.75 08/11/2022   BUN 7 08/11/2022   NA 140 08/11/2022   K 4.5 08/11/2022   CL 105 08/11/2022   CO2 21 08/11/2022   Lab Results   Component Value Date   ALT 20 08/11/2022   AST 17 08/11/2022   ALKPHOS 112 08/11/2022   BILITOT 0.3 08/11/2022   Lab Results  Component Value Date   HGBA1C 5.5 08/11/2022   HGBA1C 5.1 01/05/2022   Lab Results  Component Value Date   INSULIN 8.6 08/11/2022   Lab Results  Component Value Date   TSH 0.949 08/11/2022   Lab Results  Component Value Date   CHOL 161 08/11/2022   HDL 46 08/11/2022   LDLCALC 103 (H) 08/11/2022   TRIG 59 08/11/2022   Lab Results  Component Value Date   WBC 6.1 08/11/2022   HGB 12.9 08/11/2022   HCT 40.2 08/11/2022   MCV 90 08/11/2022   PLT 258 08/11/2022   No results found for: "IRON", "TIBC", "FERRITIN"  Attestation Statements:   Reviewed by clinician on day of visit: allergies, medications, problem list, medical history, surgical history, family history, social history, and previous encounter notes.  Time spent on visit including pre-visit chart review and post-visit charting and care was 40 minutes.   Wilhemena Durie, am acting as transcriptionist for [employer], DO.   I have reviewed the above documentation for accuracy and completeness, and I agree with the above. Marjory Sneddon, D.O.  The [place] was signed into law in 2016 which includes the topic of electronic health records.  This provides immediate access to information in MyChart.  This includes consultation notes, operative notes, office notes, lab results and pathology reports.  If you have any questions about what you read please let us know at your next visit so we can discuss your concerns and take corrective action if need be.  We are right here with you.

## 2022-09-08 ENCOUNTER — Encounter (INDEPENDENT_AMBULATORY_CARE_PROVIDER_SITE_OTHER): Payer: Self-pay | Admitting: Physician Assistant

## 2022-09-08 ENCOUNTER — Ambulatory Visit (INDEPENDENT_AMBULATORY_CARE_PROVIDER_SITE_OTHER): Payer: Medicaid Other | Admitting: Physician Assistant

## 2022-09-08 VITALS — BP 128/88 | HR 73 | Temp 98.5°F | Ht 63.0 in | Wt 225.0 lb

## 2022-09-08 DIAGNOSIS — E669 Obesity, unspecified: Secondary | ICD-10-CM

## 2022-09-08 DIAGNOSIS — E88819 Insulin resistance, unspecified: Secondary | ICD-10-CM

## 2022-09-08 DIAGNOSIS — Z6839 Body mass index (BMI) 39.0-39.9, adult: Secondary | ICD-10-CM | POA: Diagnosis not present

## 2022-09-08 DIAGNOSIS — E559 Vitamin D deficiency, unspecified: Secondary | ICD-10-CM

## 2022-09-08 MED ORDER — VITAMIN D (ERGOCALCIFEROL) 1.25 MG (50000 UNIT) PO CAPS: 50000.0000 [IU] | ORAL_CAPSULE | ORAL | 0 refills | Status: DC

## 2022-09-08 MED ORDER — METFORMIN HCL 500 MG PO TABS: 500.0000 mg | ORAL_TABLET | Freq: Every day | ORAL | 0 refills | Status: DC

## 2022-09-09 NOTE — Telephone Encounter (Signed)
error 

## 2022-09-14 NOTE — Progress Notes (Unsigned)
Chief Complaint:   Maria Grimes is here to discuss her progress with her obesity treatment plan along with follow-up of her obesity related diagnoses. Maria Grimes is on the Category 2 Plan + 100 snack and states she is following her eating plan approximately 100% of the time. Maria Grimes states she is not currently exercising.   Today's visit was #: 2 Starting weight: 229 lbs Starting date: 08/11/2022 Today's weight: 223 lbs Today's date: 08/25/2022 Total lbs lost to date: 6 lbs Total lbs lost since last in-office visit: 6  Interim History: Maria Grimes is here today for her first follow-up office visit since starting the program with Maria Grimes.  All blood work/ lab tests that were recently ordered by myself or an outside provider were reviewed with patient today per their request.   Extended time was spent counseling her on all new disease processes that were discovered or preexisting ones that are affected by BMI.  she understands that many of these abnormalities will need to monitored regularly along with the current treatment plan of prudent dietary changes, in which we are making each and every office visit, to improve these health parameters.  She is finally eating lunch but not weighing proteins at lunch.  Mostly [place] breast from deli.  Hungry after work and must go home and eat.     Subjective:   1. Insulin resistance Labs discussed during visit today.  New, Fasting blood sugars+8.6.  History of gestational DM.  2. Vitamin B12 deficiency Labs discussed during visit today.  New, notes fatigue.  3. Essential hypertension Labs discussed during visit today.  Taking HCTZ and Norvasc.  4. Other hyperlipidemia Labs discussed during visit today.  Not on medications.   5. Vitamin D deficiency Labs discussed during visit today.   Vit D level of 19.3 on a multivitamine with Vit D in it.  Assessment/Plan:  No orders of the defined types were placed in this  encounter.   Medications Discontinued During This Encounter  Medication Reason   Vitamin D, Ergocalciferol, (DRISDOL) 1.25 MG (50000 UNIT) CAPS capsule      Meds ordered this encounter  Medications   DISCONTD: Vitamin D, Ergocalciferol, (DRISDOL) 1.25 MG (50000 UNIT) CAPS capsule    Sig: Take 1 capsule (50,000 Units total) by mouth every 7 (seven) days.    Dispense:  6 capsule    Refill:  0   cyanocobalamin (VITAMIN B12) 500 MCG tablet    Sig: Take 1 tablet (500 mcg total) by mouth daily.     1. Insulin resistance New diagnoses; counseling done; handout given.  Diavion will continue to work on weight loss, exercise, and decreasing simple carbohydrates to help decrease the risk of diabetes. Maria Grimes agreed to follow-up with Maria Grimes as directed to closely monitor her progress.   2. Vitamin B12 deficiency Start B12 500 mcg daily, over the counter.  Continue Prescribed Nutrition Plan and exercise to promote weight loss.  The diagnosis was reviewed with the patient. Counseling provided today, see below. We will continue to monitor. Orders and follow up as documented in patient record.  Counseling The body needs vitamin B12: to make red blood cells; to make DNA; and to help the nerves work properly so they can carry messages from the brain to the body.  The main causes of vitamin B12 deficiency include dietary deficiency, digestive diseases, pernicious anemia, and having a surgery in which part of the stomach or small intestine is removed.  Certain medicines can make it harder  for the body to absorb vitamin B12. These medicines include: heartburn medications; some antibiotics; some medications used to treat diabetes, gout, and high cholesterol.  In some cases, there are no symptoms of this condition. If the condition leads to anemia or nerve damage, various symptoms can occur, such as weakness or fatigue, shortness of breath, and numbness or tingling in your hands and feet.   Treatment:  May  include taking vitamin B12 supplements.  Avoid alcohol.  Eat lots of healthy foods that contain vitamin B12: Beef, pork, chicken, [place], and organ meats, such as liver.  Seafood: This includes clams, rainbow trout, salmon, tuna, and haddock. Eggs.  Cereal and dairy products that are fortified: This means that vitamin B12 has been added to the food.    -Start cyanocobalamin (VITAMIN B12) 500 MCG tablet; Take 1 tablet (500 mcg total) by mouth daily.  3. Essential hypertension CMP within normal limits: blood pressure normal today, patient was rushing to get here today.  Checked at home or work.  Continue Prescribed Nutrition Plan and exercise to promote weight loss.   4. Other hyperlipidemia Slightly increased LDL at 103.  Continue Prescribed Nutrition Plan and exercise to promote weight loss.  Cardiovascular risk and specific lipid/LDL goals reviewed.  We discussed several lifestyle modifications today and Baker will continue to work on diet, exercise and weight loss efforts. Orders and follow up as documented in patient record.   Counseling Intensive lifestyle modifications are the first line treatment for this issue. Dietary changes: Increase soluble fiber. Decrease simple carbohydrates. Exercise changes: Moderate to vigorous-intensity aerobic activity 150 minutes per week if tolerated. Lipid-lowering medications: see documented in medical record.   5. Vitamin D deficiency Not at goal.  Will start ergocalciferol 50K weekly.  Low Vitamin D level contributes to fatigue and are associated with obesity, breast, and colon cancer. She agrees to continue to take prescription Vitamin D @50$ ,000 IU every week and will follow-up for routine testing of Vitamin D, at least 2-3 times per year to avoid over-replacement.   6. Obesity with current BMI 39.5 Maria Grimes is currently in the action stage of change. As such, her goal is to continue with weight loss efforts. She has agreed to the Category 2  Plan with only 100 calorie snacks.  Goals; 1. To measure proteins and veggies.   I.R given to patient; After long discussion.  Exercise goals: As is.  Behavioral modification strategies: increasing lean protein intake and decreasing simple carbohydrates.  Katherinne has agreed to follow-up with our clinic in 3 weeks. She was informed of the importance of frequent follow-up visits to maximize her success with intensive lifestyle modifications for her multiple health conditions.   Objective:   Blood pressure (!) 145/84, pulse 78, temperature 97.9 F (36.6 C), height 5' 3"$  (1.6 m), weight 223 lb 3.2 oz (101.2 kg), SpO2 98 %. Body mass index is 39.54 kg/m.  General: Cooperative, alert, well developed, in no acute distress. HEENT: Conjunctivae and lids unremarkable. Cardiovascular: Regular rhythm.  Lungs: Normal work of breathing. Neurologic: No focal deficits.   Lab Results  Component Value Date   CREATININE 0.75 08/11/2022   BUN 7 08/11/2022   NA 140 08/11/2022   K 4.5 08/11/2022   CL 105 08/11/2022   CO2 21 08/11/2022   Lab Results  Component Value Date   ALT 20 08/11/2022   AST 17 08/11/2022   ALKPHOS 112 08/11/2022   BILITOT 0.3 08/11/2022   Lab Results  Component Value Date  HGBA1C 5.5 08/11/2022   HGBA1C 5.1 01/05/2022   Lab Results  Component Value Date   INSULIN 8.6 08/11/2022   Lab Results  Component Value Date   TSH 0.949 08/11/2022   Lab Results  Component Value Date   CHOL 161 08/11/2022   HDL 46 08/11/2022   LDLCALC 103 (H) 08/11/2022   TRIG 59 08/11/2022   Lab Results  Component Value Date   VD25OH 19.3 (L) 08/11/2022   VD25OH 8.2 (L) 01/05/2022   Lab Results  Component Value Date   WBC 6.1 08/11/2022   HGB 12.9 08/11/2022   HCT 40.2 08/11/2022   MCV 90 08/11/2022   PLT 258 08/11/2022   No results found for: "IRON", "TIBC", "FERRITIN"  Attestation Statements:   Reviewed by clinician on day of visit: allergies, medications, problem  list, medical history, surgical history, family history, social history, and previous encounter notes.  Time spent on visit including pre-visit chart review and post-visit care and charting was 40 minutes.   I, Brendell Tyus, CMA, am acting as transcriptionist for [employer], DO.   I have reviewed the above documentation for accuracy and completeness, and I agree with the above. Marjory Sneddon, D.O.  The [place] was signed into law in 2016 which includes the topic of electronic health records.  This provides immediate access to information in MyChart.  This includes consultation notes, operative notes, office notes, lab results and pathology reports.  If you have any questions about what you read please let Maria Grimes know at your next visit so we can discuss your concerns and take corrective action if need be.  We are right here with you.

## 2022-09-16 NOTE — Progress Notes (Unsigned)
Chief Complaint:   Maria Grimes is here to discuss her progress with her obesity treatment plan along with follow-up of her obesity related diagnoses. Maria Grimes is on the Category 2 Plan and states she is following her eating plan approximately 95% of the time. Maria Grimes states she is exercising 0 minutes 0 times per week.  Today's visit was #: 3 Starting weight: 229 lbs Starting date: 08/11/2022 Today's weight: 225 lbs Today's date: 09/08/2022 Total lbs lost to date: 4 lbs Total lbs lost since last in-office visit: 0  Interim History: Maria Grimes is doing a better job of measuring protein amounts and getting enough vegetables.  She is not exercising regularly right now.  She went to [place] for her [place] celebration and indulged in celebration eating/drinking but got back on track quickly with her nutrition plan.  She is craving chips and we discussed high protein chip options.  Works at the [employer] and has 2 children 6 and 10 yr olds and very busy with after school activities.    Subjective:   1. Insulin resistance Maria Grimes wants to start metformin at this time and we discussed possible side effects, risks and benefits.   On 08/11/22-A1c was 5.5/insulin was 8.6- not at goal.  She is working on decreasing simple carbs, increasing protein, healthy eating and exercise to promote weight loss and improve insulin resistance.  2. Vitamin D deficiency On 08/11/22, Vit D level of 19.3.  On ergocalciferol once a week.No side effects with ergocalciferol.   Assessment/Plan:   1. Insulin resistance Will Start Metformin 500 mg daily for 1 month with 0 refills.  Continue Prescribed Nutrition Plan and exercise to promote weight loss and improve insulin resistance and glycemic control.   -Start metFORMIN (GLUCOPHAGE) 500 MG tablet; Take 1 tablet (500 mg total) by mouth daily.  Dispense: 30 tablet; Refill: 0  2. Vitamin D deficiency Continue/Refill ergocalciferol once a week for 1  month with 0 refills.  -Refill Vitamin D, Ergocalciferol, (DRISDOL) 1.25 MG (50000 UNIT) CAPS capsule; Take 1 capsule (50,000 Units total) by mouth every 7 (seven) days.  Dispense: 6 capsule; Refill: 0  3. BMI 39.0-39.9,adult, Current BMI 39.9  4. Obesity (HCC)-start bmi 40.21 Maria Grimes is currently in the action stage of change. As such, her goal is to continue with weight loss efforts. She has agreed to the Category 2 Plan.   Exercise goals: All adults should avoid inactivity. Some physical activity is better than none, and adults who participate in any amount of physical activity gain some health benefits.  Behavioral modification strategies: increasing lean protein intake, decreasing simple carbohydrates, increasing water intake, meal planning and cooking strategies, and celebration eating strategies.  Maria Grimes has agreed to follow-up with our clinic in 3 weeks. She was informed of the importance of frequent follow-up visits to maximize her success with intensive lifestyle modifications for her multiple health conditions.   Objective:   Blood pressure 128/88, pulse 73, temperature 98.5 F (36.9 C), height 5' 3"$  (1.6 m), weight 225 lb (102.1 kg), SpO2 97 %. Body mass index is 39.86 kg/m.  General: Cooperative, alert, well developed, in no acute distress. HEENT: Conjunctivae and lids unremarkable. Cardiovascular: Regular rhythm.  Lungs: Normal work of breathing. Neurologic: No focal deficits.   Lab Results  Component Value Date   CREATININE 0.75 08/11/2022   BUN 7 08/11/2022   NA 140 08/11/2022   K 4.5 08/11/2022   CL 105 08/11/2022   CO2 21 08/11/2022   Lab Results  Component Value Date   ALT 20 08/11/2022   AST 17 08/11/2022   ALKPHOS 112 08/11/2022   BILITOT 0.3 08/11/2022   Lab Results  Component Value Date   HGBA1C 5.5 08/11/2022   HGBA1C 5.1 01/05/2022   Lab Results  Component Value Date   INSULIN 8.6 08/11/2022   Lab Results  Component Value Date   TSH  0.949 08/11/2022   Lab Results  Component Value Date   CHOL 161 08/11/2022   HDL 46 08/11/2022   LDLCALC 103 (H) 08/11/2022   TRIG 59 08/11/2022   Lab Results  Component Value Date   VD25OH 19.3 (L) 08/11/2022   VD25OH 8.2 (L) 01/05/2022   Lab Results  Component Value Date   WBC 6.1 08/11/2022   HGB 12.9 08/11/2022   HCT 40.2 08/11/2022   MCV 90 08/11/2022   PLT 258 08/11/2022   No results found for: "IRON", "TIBC", "FERRITIN"  Attestation Statements:   Reviewed by clinician on day of visit: allergies, medications, problem list, medical history, surgical history, family history, social history, and previous encounter notes.  I, Brendell Tyus, am acting as transcriptionist for [employer], PA.  I have reviewed the above documentation for accuracy and completeness, and I agree with the above. -  Ayda Tancredi,PA-C

## 2022-09-24 ENCOUNTER — Ambulatory Visit (INDEPENDENT_AMBULATORY_CARE_PROVIDER_SITE_OTHER): Payer: 59 | Admitting: Physician Assistant

## 2022-09-24 ENCOUNTER — Encounter (INDEPENDENT_AMBULATORY_CARE_PROVIDER_SITE_OTHER): Payer: Self-pay | Admitting: Physician Assistant

## 2022-09-24 VITALS — BP 131/83 | HR 90 | Temp 97.8°F | Ht 63.0 in | Wt 222.6 lb

## 2022-09-24 DIAGNOSIS — Z6839 Body mass index (BMI) 39.0-39.9, adult: Secondary | ICD-10-CM | POA: Diagnosis not present

## 2022-09-24 DIAGNOSIS — E669 Obesity, unspecified: Secondary | ICD-10-CM

## 2022-09-24 DIAGNOSIS — E88819 Insulin resistance, unspecified: Secondary | ICD-10-CM

## 2022-09-24 DIAGNOSIS — E559 Vitamin D deficiency, unspecified: Secondary | ICD-10-CM | POA: Diagnosis not present

## 2022-09-24 NOTE — Progress Notes (Signed)
Chief Complaint:   Granite is here to discuss her progress with her obesity treatment plan along with follow-up of her obesity related diagnoses. Kamela is on the Category 2 Plan and states she is following her eating plan approximately 80% of the time. Evelin states she is not exercising regularly currently 0 minutes 0 times per week.  Today's visit was #: 4 Starting weight: 229 lbs Starting date: 08/11/2022 Today's weight: 222 lbs Today's date: 09/24/2022 Total lbs lost to date: 7 lbs Total lbs lost since last in-office visit: 3 lbs  Interim History: Janeese has done well with weight loss. She has been focusing on getting adequate protein.  Hunger is controlled overall, but does note some cravings for potatoes/rice.  Has been using cauliflower rice  and noodles. Tried black bean noodles and liked.  She is very busy working at the [employer] at [employer] and caring for her 2 children. She is going to try to start walking the parking deck at lunch or breaks at work to increase activity.   Subjective:   1. Insulin resistance A1c 5.5/ Insulin 8.6 on 08/11/22- Started metformin 500 mg daily and tolerating well without side effects. No refill needed yet.  Working on decreasing simple carbohydrates, increasing lean protein and exercise to promote weight loss and improve insulin resistance.   2. Vitamin D deficiency Vit D level 19.3 08/11/22- Not at goal. On Ergocalciferol once weekly without side effects. + fatigue.   3. Obesity (HCC)-start bmi 40.21 4. BMI 39.0-39.9,adult, Current BMI- 39.4  Assessment/Plan:   1. Insulin resistance Continue metformin 500 mg daily and continue to work on nutrition plan and exercise to promote weight loss and improve insulin resistance.   2. Vitamin D deficiency Continue Ergocalciferol 50,000 IU weekly. Recheck Vit D level at least 2-3 times yearly to avoid over supplementation.   3. Obesity (HCC)-start bmi 40.21 4.  BMI 39.0-39.9,adult, Current BMI- 39.4  Kiearra is currently in the action stage of change. As such, her goal is to continue with weight loss efforts. She has agreed to the Category 2 Plan.   Exercise goals: All adults should avoid inactivity. Some physical activity is better than none, and adults who participate in any amount of physical activity gain some health benefits.  Behavioral modification strategies: increasing lean protein intake, decreasing simple carbohydrates, better snacking choices, and avoiding temptations.  Saber has agreed to follow-up with our clinic in 2 weeks. She was informed of the importance of frequent follow-up visits to maximize her success with intensive lifestyle modifications for her multiple health conditions.     Objective:   Blood pressure 131/83, pulse 90, temperature 97.8 F (36.6 C), height 5' 3"$  (1.6 m), weight 222 lb 9.6 oz (101 kg), SpO2 100 %. Body mass index is 39.43 kg/m.  General: Cooperative, alert, well developed, in no acute distress. HEENT: Conjunctivae and lids unremarkable. Cardiovascular: Regular rhythm.  Lungs: Normal work of breathing. Neurologic: No focal deficits.   Lab Results  Component Value Date   CREATININE 0.75 08/11/2022   BUN 7 08/11/2022   NA 140 08/11/2022   K 4.5 08/11/2022   CL 105 08/11/2022   CO2 21 08/11/2022   Lab Results  Component Value Date   ALT 20 08/11/2022   AST 17 08/11/2022   ALKPHOS 112 08/11/2022   BILITOT 0.3 08/11/2022   Lab Results  Component Value Date   HGBA1C 5.5 08/11/2022   HGBA1C 5.1 01/05/2022   Lab Results  Component Value Date   INSULIN 8.6 08/11/2022   Lab Results  Component Value Date   TSH 0.949 08/11/2022   Lab Results  Component Value Date   CHOL 161 08/11/2022   HDL 46 08/11/2022   LDLCALC 103 (H) 08/11/2022   TRIG 59 08/11/2022   Lab Results  Component Value Date   VD25OH 19.3 (L) 08/11/2022   VD25OH 8.2 (L) 01/05/2022   Lab Results  Component  Value Date   WBC 6.1 08/11/2022   HGB 12.9 08/11/2022   HCT 40.2 08/11/2022   MCV 90 08/11/2022   PLT 258 08/11/2022   No results found for: "IRON", "TIBC", "FERRITIN"  Obesity Behavioral Intervention:   Approximately 15 minutes were spent on the discussion below.  ASK: We discussed the diagnosis of obesity with Roland Rack today and Shenice agreed to give [place] permission to discuss obesity behavioral modification therapy today.  ASSESS: Erendira has the diagnosis of obesity and her BMI today is 39.4. Alaiia is in the action stage of change.   ADVISE: Oluchi was educated on the multiple health risks of obesity as well as the benefit of weight loss to improve her health. She was advised of the need for long term treatment and the importance of lifestyle modifications to improve her current health and to decrease her risk of future health problems.  AGREE: Multiple dietary modification options and treatment options were discussed and Mairlyn agreed to follow the recommendations documented in the above note.  ARRANGE: Chamari was educated on the importance of frequent visits to treat obesity as outlined per CMS and USPSTF guidelines and agreed to schedule her next follow up appointment today.  Attestation Statements:   Reviewed by clinician on day of visit: allergies, medications, problem list, medical history, surgical history, family history, social history, and previous encounter notes.  Time spent on visit including pre-visit chart review and post-visit care and charting was 30 minutes.    Maizey Menendez,PA-C

## 2022-10-22 ENCOUNTER — Encounter (INDEPENDENT_AMBULATORY_CARE_PROVIDER_SITE_OTHER): Payer: Self-pay | Admitting: Physician Assistant

## 2022-10-22 ENCOUNTER — Ambulatory Visit (INDEPENDENT_AMBULATORY_CARE_PROVIDER_SITE_OTHER): Payer: 59 | Admitting: Physician Assistant

## 2022-10-22 ENCOUNTER — Other Ambulatory Visit (HOSPITAL_BASED_OUTPATIENT_CLINIC_OR_DEPARTMENT_OTHER): Payer: Self-pay

## 2022-10-22 VITALS — BP 137/96 | HR 76 | Temp 98.1°F | Ht 63.0 in | Wt 223.0 lb

## 2022-10-22 DIAGNOSIS — E669 Obesity, unspecified: Secondary | ICD-10-CM

## 2022-10-22 DIAGNOSIS — E88819 Insulin resistance, unspecified: Secondary | ICD-10-CM | POA: Diagnosis not present

## 2022-10-22 DIAGNOSIS — Z6839 Body mass index (BMI) 39.0-39.9, adult: Secondary | ICD-10-CM | POA: Diagnosis not present

## 2022-10-22 DIAGNOSIS — E559 Vitamin D deficiency, unspecified: Secondary | ICD-10-CM

## 2022-10-22 MED ORDER — METFORMIN HCL 500 MG PO TABS
500.0000 mg | ORAL_TABLET | Freq: Every day | ORAL | 0 refills | Status: DC
  Filled 2022-10-22: qty 30, 30d supply, fill #0

## 2022-10-22 MED ORDER — VITAMIN D (ERGOCALCIFEROL) 1.25 MG (50000 UNIT) PO CAPS
50000.0000 [IU] | ORAL_CAPSULE | ORAL | 0 refills | Status: DC
  Filled 2022-10-22: qty 6, 42d supply, fill #0

## 2022-10-22 MED ORDER — METFORMIN HCL 500 MG PO TABS
500.0000 mg | ORAL_TABLET | Freq: Two times a day (BID) | ORAL | 0 refills | Status: DC
  Filled 2022-10-22: qty 60, 30d supply, fill #0

## 2022-10-22 NOTE — Assessment & Plan Note (Signed)
Vitamin D Deficiency Vitamin D is not at goal of 50.  Most recent vitamin D level was 19.3. She is on  prescription ergocalciferol 50,000 IU weekly. Lab Results  Component Value Date   VD25OH 19.3 (L) 08/11/2022   VD25OH 8.2 (L) 01/05/2022    Plan: Continue/Refill prescription vitamin D 50,000 IU weekly.

## 2022-10-22 NOTE — Assessment & Plan Note (Signed)
Insulin Resistance Last fasting insulin was 8.6. A1c was 5.5. Polyphagia:Yes Medication(s): Metformin 500 mg once daily breakfast No GI or other side effects with metformin.  Lab Results  Component Value Date   HGBA1C 5.5 08/11/2022   HGBA1C 5.1 01/05/2022   Lab Results  Component Value Date   INSULIN 8.6 08/11/2022    Plan: Continue and increase dose Metformin 500 mg twice daily with meals Continue working on nutrition plan to decrease simple carbohydrates, increase lean proteins and exercise to promote weight loss, improve glycemic control and prevent progression to Type 2 diabetes.  Increase protein intake. Increase fiber intake.

## 2022-10-22 NOTE — Progress Notes (Signed)
Office: [phone]  /  Fax: [phone]  WEIGHT SUMMARY AND BIOMETRICS  Vitals Temp: 98.1 F (36.7 C) BP: (!) 137/96 Pulse Rate: 76 SpO2: 100 %   Anthropometric Measurements Height: 5\' 3"  (1.6 m) Weight: 223 lb (101.2 kg) BMI (Calculated): 39.51 Weight at Last Visit: 222 lb Weight Lost Since Last Visit: 0 lb Weight Gained Since Last Visit: 1 lb Starting Weight: 229 lb Total Weight Loss (lbs): 7 lb (3.175 kg)   Body Composition  Body Fat %: 47.8 % Fat Mass (lbs): 106.6 lbs Muscle Mass (lbs): 110.8 lbs Total Body Water (lbs): 81.2 lbs Visceral Fat Rating : 13   Other Clinical Data Fasting: yes Labs: no Today's Visit #: 5 Starting Date: 08/11/22     HPI  Chief Complaint: OBESITY  Maria Grimes is here to discuss her progress with her obesity treatment plan. She is on the the Category 2 Plan and states she is following her eating plan approximately (not sure what plan she is on)?% of the time. She states she is exercising/weight lifting 20 minutes 3 times per week.   Interval History:  Since last office visit she has started to do some gentle weight lifting.  Hunger/appetite not excessive. Struggles with evening snacking/craving and discussed higher protein snack options. Plans to start walking more as weather warms.  We discussed the importance of regular meals and adequate protein intake for weight loss and overall health.  Pharmacotherapy: Metformin for insulin resistance. No side effects . Feels it helps some with hunger during the daytime.   PHYSICAL EXAM:  Blood pressure (!) 137/96, pulse 76, temperature 98.1 F (36.7 C), height 5\' 3"  (1.6 m), weight 223 lb (101.2 kg), SpO2 100 %. Body mass index is 39.5 kg/m.  General: She is overweight, cooperative, alert, well developed, and in no acute distress. PSYCH: Has normal mood, affect and thought process.   Lungs: Normal breathing effort, no conversational dyspnea.  DIAGNOSTIC DATA  REVIEWED:  BMET    Component Value Date/Time   NA 140 08/11/2022 1155   K 4.5 08/11/2022 1155   CL 105 08/11/2022 1155   CO2 21 08/11/2022 1155   GLUCOSE 88 08/11/2022 1155   GLUCOSE 96 04/15/2017 2051   BUN 7 08/11/2022 1155   CREATININE 0.75 08/11/2022 1155   CALCIUM 9.1 08/11/2022 1155   GFRNONAA >60 04/15/2017 2051   GFRAA >60 04/15/2017 2051   Lab Results  Component Value Date   HGBA1C 5.5 08/11/2022   HGBA1C 5.1 01/05/2022   Lab Results  Component Value Date   INSULIN 8.6 08/11/2022   Lab Results  Component Value Date   TSH 0.949 08/11/2022   CBC    Component Value Date/Time   WBC 6.1 08/11/2022 1155   WBC 7.5 04/15/2017 2051   RBC 4.47 08/11/2022 1155   RBC 4.46 04/15/2017 2051   HGB 12.9 08/11/2022 1155   HCT 40.2 08/11/2022 1155   PLT 258 08/11/2022 1155   MCV 90 08/11/2022 1155   MCH 28.9 08/11/2022 1155   MCH 28.7 04/15/2017 2051   MCHC 32.1 08/11/2022 1155   MCHC 32.8 04/15/2017 2051   RDW 12.8 08/11/2022 1155   Iron Studies No results found for: "IRON", "TIBC", "FERRITIN", "IRONPCTSAT" Lipid Panel     Component Value Date/Time   CHOL 161 08/11/2022 1155   TRIG 59 08/11/2022 1155   HDL 46 08/11/2022 1155   LDLCALC 103 (H) 08/11/2022 1155   Hepatic Function Panel     Component Value Date/Time   PROT  7.1 08/11/2022 1155   ALBUMIN 4.1 08/11/2022 1155   AST 17 08/11/2022 1155   ALT 20 08/11/2022 1155   ALKPHOS 112 08/11/2022 1155   BILITOT 0.3 08/11/2022 1155      Component Value Date/Time   TSH 0.949 08/11/2022 1155   Nutritional Lab Results  Component Value Date   VD25OH 19.3 (L) 08/11/2022   VD25OH 8.2 (L) 01/05/2022    ASSOCIATED CONDITIONS ADDRESSED TODAY  ASSESSMENT AND PLAN  Problem List Items Addressed This Visit     Vitamin D deficiency    Vitamin D Deficiency Vitamin D is not at goal of 50.  Most recent vitamin D level was 19.3. She is on  prescription ergocalciferol 50,000 IU weekly. Lab Results  Component  Value Date   VD25OH 19.3 (L) 08/11/2022   VD25OH 8.2 (L) 01/05/2022   Plan: Continue/Refill prescription vitamin D 50,000 IU weekly.        Relevant Medications   Vitamin D, Ergocalciferol, (DRISDOL) 1.25 MG (50000 UNIT) CAPS capsule   Insulin resistance - Primary    Insulin Resistance Last fasting insulin was 8.6. A1c was 5.5. Polyphagia:Yes Medication(s): Metformin 500 mg once daily breakfast No GI or other side effects with metformin.  Lab Results  Component Value Date   HGBA1C 5.5 08/11/2022   HGBA1C 5.1 01/05/2022   Lab Results  Component Value Date   INSULIN 8.6 08/11/2022   Plan: Continue and increase dose Metformin 500 mg twice daily with meals Continue working on nutrition plan to decrease simple carbohydrates, increase lean proteins and exercise to promote weight loss, improve glycemic control and prevent progression to Type 2 diabetes.  Increase protein intake. Increase fiber intake.        Relevant Medications   metFORMIN (GLUCOPHAGE) 500 MG tablet   BMI 39.0-39.9,adult, Current BMI 39.9   Obesity (HCC)-start bmi 40.21   Relevant Medications   metFORMIN (GLUCOPHAGE) 500 MG tablet      TREATMENT PLAN FOR OBESITY:  Recommended [place] is currently in the action stage of change. As such, her goal is to continue weight management plan. She has agreed to the Category 2 Plan.  Behavioral Intervention  We discussed the following Behavioral Modification Strategies today: increasing lean protein intake, decreasing simple carbohydrates , increasing vegetables, increasing water intake, work on meal planning and easy cooking plans, planning for success, and keeping healthy foods at home.  Additional resources provided today: NA  Recommended Physical Activity Goals  Aidana has been advised to work up to 150 minutes of moderate intensity aerobic activity a week and strengthening exercises 2-3 times per week for cardiovascular health, weight  loss maintenance and preservation of muscle mass.   She has agreed to Continue current level of physical activity    Pharmacotherapy We discussed various medication options to help [facility] with her weight loss efforts and we both agreed to increase metformin to twice daily for insulin resistance. We also had a brief discussion concerning cravings and discussed topiramate as possible option to help with evening cravings. We are going to increase the metformin first and see how she does with this change. (Patient denies history of glaucoma or kidney stones. She was informed of side effects and that topiramate is teratogenic. She currently uses IUD for birth control.)    Return in about 3 weeks (around 11/12/2022).Marland Kitchen She was informed of the importance of frequent follow up visits to maximize her success with intensive lifestyle modifications for her multiple health conditions.   ATTESTASTION STATEMENTS:  Reviewed by clinician on day of visit: allergies, medications, problem list, medical history, surgical history, family history, social history, and previous encounter notes.   I have personally spent 33 minutes total time today in preparation, patient care, nutritional counseling and documentation for this visit, including the following: review of clinical lab tests; review of medical tests/procedures/services.      Sherrina Zaugg, PA-C

## 2022-11-09 ENCOUNTER — Ambulatory Visit (INDEPENDENT_AMBULATORY_CARE_PROVIDER_SITE_OTHER): Payer: 59 | Admitting: Family Medicine

## 2022-11-24 ENCOUNTER — Encounter (INDEPENDENT_AMBULATORY_CARE_PROVIDER_SITE_OTHER): Payer: Self-pay | Admitting: Physician Assistant

## 2022-11-24 ENCOUNTER — Ambulatory Visit (INDEPENDENT_AMBULATORY_CARE_PROVIDER_SITE_OTHER): Payer: 59 | Admitting: Physician Assistant

## 2022-11-24 VITALS — BP 134/92 | HR 79 | Temp 98.2°F | Ht 63.0 in | Wt 223.0 lb

## 2022-11-24 DIAGNOSIS — E669 Obesity, unspecified: Secondary | ICD-10-CM | POA: Diagnosis not present

## 2022-11-24 DIAGNOSIS — Z6839 Body mass index (BMI) 39.0-39.9, adult: Secondary | ICD-10-CM | POA: Diagnosis not present

## 2022-11-24 DIAGNOSIS — E88819 Insulin resistance, unspecified: Secondary | ICD-10-CM

## 2022-11-24 DIAGNOSIS — E559 Vitamin D deficiency, unspecified: Secondary | ICD-10-CM

## 2022-11-24 DIAGNOSIS — E7849 Other hyperlipidemia: Secondary | ICD-10-CM

## 2022-11-24 DIAGNOSIS — E538 Deficiency of other specified B group vitamins: Secondary | ICD-10-CM | POA: Diagnosis not present

## 2022-11-24 DIAGNOSIS — I10 Essential (primary) hypertension: Secondary | ICD-10-CM | POA: Diagnosis not present

## 2022-11-24 MED ORDER — VITAMIN D (ERGOCALCIFEROL) 1.25 MG (50000 UNIT) PO CAPS
50000.0000 [IU] | ORAL_CAPSULE | ORAL | 0 refills | Status: DC
Start: 2022-11-24 — End: 2023-03-03
  Filled 2022-11-28: qty 6, 42d supply, fill #0

## 2022-11-24 MED ORDER — AMLODIPINE BESYLATE 2.5 MG PO TABS
2.5000 mg | ORAL_TABLET | Freq: Every day | ORAL | 0 refills | Status: DC
  Filled 2022-11-28: qty 90, 90d supply, fill #0

## 2022-11-24 MED ORDER — CYANOCOBALAMIN 500 MCG PO TABS: 500.0000 ug | ORAL_TABLET | Freq: Every day | ORAL | Status: AC

## 2022-11-24 MED ORDER — METFORMIN HCL 500 MG PO TABS
500.0000 mg | ORAL_TABLET | Freq: Two times a day (BID) | ORAL | 0 refills | Status: DC
Start: 2022-11-24 — End: 2023-03-03
  Filled 2022-11-28: qty 60, 30d supply, fill #0

## 2022-11-24 NOTE — Progress Notes (Signed)
Office: [phone]  /  Fax: [phone]  WEIGHT SUMMARY AND BIOMETRICS  Vitals Temp: 98.2 F (36.8 C) BP: (!) 134/92 Pulse Rate: 79 SpO2: 98 %   Anthropometric Measurements Height:  (1.6 m) Weight: 223 lb (101.2 kg) BMI (Calculated): 39.51 Weight at Last Visit: 223 lb Weight Lost Since Last Visit: 0 lb Weight Gained Since Last Visit: 0 lb Starting Weight: 229 lb Total Weight Loss (lbs): 7 lb (3.175 kg)   Body Composition  Body Fat %: 46.7 % Fat Mass (lbs): 104.4 lbs Muscle Mass (lbs): 113 lbs Total Body Water (lbs): 80 lbs Visceral Fat Rating : 12   Other Clinical Data Fasting: yes Labs: no Today's Visit #: 6 Starting Date: 08/11/22     HPI  Chief Complaint: OBESITY  Maria Grimes is here to discuss her progress with her obesity treatment plan. She is on the the Category 2 Plan and states she is following her eating plan approximately 100 % of the time. She states she is exercising/walking 40 minutes 3 times per week.   Interval History:  Since last office visit she maintained. Bio impedence scale: Up 2.8 lbs in muscle mass and down 2.2 lbs adipose mass, down 1.2 lbs water  Good adherence to nutrition plan Hunger/appetite well controlled overall but does note cravings in evenings.  Stress level- works at [employer] center at [employer] and stressful, but mostly manageable Sleep- not restorative even when allows for enough time to sleep.  Exercise- consistently walking at least 3 nights weekly.  Busy with family- 52 yr old son had B Day celebration- monster truck event and    28 yr old daughter with weekend competitions.   Family cruise planned for July this year.   Pharmacotherapy: metformin for insulin resistance.   PHYSICAL EXAM:  Blood pressure (!) 134/92, pulse 79, temperature 98.2 F (36.8 C), height  (1.6 m), weight 223 lb (101.2 kg), SpO2 98 %. Body mass index is 39.5 kg/m.  General: She is overweight, cooperative, alert, well  developed, and in no acute distress. PSYCH: Has normal mood, affect and thought process.   Cardiovascular: Regular rhythm, HR 70's Lungs: Normal breathing effort, no conversational dyspnea. Neuro: intact  DIAGNOSTIC DATA REVIEWED:  BMET    Component Value Date/Time   NA 140 08/11/2022 1155   K 4.5 08/11/2022 1155   CL 105 08/11/2022 1155   CO2 21 08/11/2022 1155   GLUCOSE 88 08/11/2022 1155   GLUCOSE 96 04/15/2017 2051   BUN 7 08/11/2022 1155   CREATININE 0.75 08/11/2022 1155   CALCIUM 9.1 08/11/2022 1155   GFRNONAA >60 04/15/2017 2051   GFRAA >60 04/15/2017 2051   Lab Results  Component Value Date   HGBA1C 5.5 08/11/2022   HGBA1C 5.1 01/05/2022   Lab Results  Component Value Date   INSULIN 8.6 08/11/2022   Lab Results  Component Value Date   TSH 0.949 08/11/2022   CBC    Component Value Date/Time   WBC 6.1 08/11/2022 1155   WBC 7.5 04/15/2017 2051   RBC 4.47 08/11/2022 1155   RBC 4.46 04/15/2017 2051   HGB 12.9 08/11/2022 1155   HCT 40.2 08/11/2022 1155   PLT 258 08/11/2022 1155   MCV 90 08/11/2022 1155   MCH 28.9 08/11/2022 1155   MCH 28.7 04/15/2017 2051   MCHC 32.1 08/11/2022 1155   MCHC 32.8 04/15/2017 2051   RDW 12.8 08/11/2022 1155   Iron Studies No results found for: "IRON", "TIBC", "FERRITIN", "IRONPCTSAT" Lipid Panel  Component Value Date/Time   CHOL 161 08/11/2022 1155   TRIG 59 08/11/2022 1155   HDL 46 08/11/2022 1155   LDLCALC 103 (H) 08/11/2022 1155   Hepatic Function Panel     Component Value Date/Time   PROT 7.1 08/11/2022 1155   ALBUMIN 4.1 08/11/2022 1155   AST 17 08/11/2022 1155   ALT 20 08/11/2022 1155   ALKPHOS 112 08/11/2022 1155   BILITOT 0.3 08/11/2022 1155      Component Value Date/Time   TSH 0.949 08/11/2022 1155   Nutritional Lab Results  Component Value Date   VD25OH 19.3 (L) 08/11/2022   VD25OH 8.2 (L) 01/05/2022    ASSOCIATED CONDITIONS ADDRESSED TODAY  ASSESSMENT AND PLAN  Problem List Items  Addressed This Visit     Vitamin D deficiency    Vitamin D Deficiency Vitamin D is not at goal of 50.  Most recent vitamin D level was 19.3. She is on  prescription ergocalciferol 50,000 IU weekly. Lab Results  Component Value Date   VD25OH 19.3 (L) 08/11/2022   VD25OH 8.2 (L) 01/05/2022   Plan: Continue and refill  prescription ergocalciferol 50,000 IU weekly Low vitamin D levels can be associated with adiposity and may result in leptin resistance and weight gain. Also associated with fatigue. Currently on vitamin D supplementation without any adverse effects.  Recheck labs next visit.        Relevant Medications   Vitamin D, Ergocalciferol, (DRISDOL) 1.25 MG (50000 UNIT) CAPS capsule   Essential hypertension    Hypertension Hypertension no significant medication side effects noted, borderline controlled, and needs further observation.  Medication(s): amlodipine 2.5 mg daily.    HCTZ 25 mg daily BP better at home and notes rushing in for visit in early mornings. Will monitor. No symptoms of hypotension. No side effects with medication.   BP Readings from Last 3 Encounters:  11/24/22 (!) 134/92  10/22/22 (!) 137/96  09/24/22 131/83   Lab Results  Component Value Date   CREATININE 0.75 08/11/2022   CREATININE 0.74 01/05/2022   CREATININE 0.71 04/15/2017  No results found for: "GFR"  Plan: Continue all antihypertensives at current dosages.Refill amlodipine x 90 days.  Monitor closely.  Continue to work on nutrition plan to promote weight loss and improve BP control.  Recheck labs next visit.        Relevant Medications   amLODipine (NORVASC) 2.5 MG tablet   Insulin resistance - Primary    Insulin Resistance Last fasting insulin was 8.6. A1c was 5.5. Polyphagia:No Medication(s): Metformin 500 mg twice daily with meals No GI side effects with metformin.  She is working on Engineer, technical sales to decrease simple carbohydrates, increase lean proteins and exercise to promote  weight loss, improve glycemic control and prevent progression to Type 2 diabetes.   Lab Results  Component Value Date   HGBA1C 5.5 08/11/2022   HGBA1C 5.1 01/05/2022   Lab Results  Component Value Date   INSULIN 8.6 08/11/2022   Plan: Continue and refill Metformin 500 mg twice daily with meals Increase protein intake. Increase fiber intake.  Continue working on nutrition plan to decrease simple carbohydrates, increase lean proteins and exercise to promote weight loss, improve glycemic control and prevent progression to Type 2 diabetes.  Recheck labs next visit      Relevant Medications   metFORMIN (GLUCOPHAGE) 500 MG tablet   BMI 39.0-39.9,adult, Current BMI 39.9   Obesity (HCC)-start bmi 40.21   Relevant Medications   metFORMIN (GLUCOPHAGE) 500 MG tablet  Vitamin B12 deficiency    Endorses fatigue even when allows for time to sleep, feels sleep is not always restorative.   Plan: Continue MVI, vitamin B 12 and Vitamin D and recheck labs next visit.       Relevant Medications   cyanocobalamin (VITAMIN B12) 500 MCG tablet  Current BMI 39.6 Showing increase in muscle mass.   TREATMENT PLAN FOR OBESITY:  Recommended Dietary Goals  Lylie is currently in the action stage of change. As such, her goal is to continue weight management plan. She has agreed to the Category 2 Plan.  Behavioral Intervention  We discussed the following Behavioral Modification Strategies today: increasing lean protein intake, decreasing simple carbohydrates , increasing vegetables, increasing lower glycemic fruits, increasing fiber rich foods, increasing water intake, continue to practice mindfulness when eating, and planning for success.  Additional resources provided today: NA  Recommended Physical Activity Goals  Ariannie has been advised to work up to 150 minutes of moderate intensity aerobic activity a week and strengthening exercises 2-3 times per week for cardiovascular health, weight  loss maintenance and preservation of muscle mass.   She has agreed to Continue current level of physical activity    Pharmacotherapy We discussed various medication options to help [facility] with her weight loss efforts and we both agreed to continue metformin for insulin resistance and continue to work on nutritional and behavioral strategies to promote weight loss.      Return in about 4 weeks (around 12/22/2022) for Fasting Lab.Marland Kitchen She was informed of the importance of frequent follow up visits to maximize her success with intensive lifestyle modifications for her multiple health conditions.   ATTESTASTION STATEMENTS:  Reviewed by clinician on day of visit: allergies, medications, problem list, medical history, surgical history, family history, social history, and previous encounter notes.   I have personally spent 30 minutes total time today in preparation, patient care, nutritional counseling and documentation for this visit, including the following: review of clinical lab tests; review of medical tests/procedures/services.      Chiron Campione, PA-C

## 2022-11-24 NOTE — Assessment & Plan Note (Addendum)
Hypertension Hypertension no significant medication side effects noted, borderline controlled, and needs further observation.  Medication(s): amlodipine 2.5 mg daily.    HCTZ 25 mg daily BP better at home and notes rushing in for visit in early mornings. Will monitor. No symptoms of hypotension. No side effects with medication.   BP Readings from Last 3 Encounters:  11/24/22 (!) 134/92  10/22/22 (!) 137/96  09/24/22 131/83   Lab Results  Component Value Date   CREATININE 0.75 08/11/2022   CREATININE 0.74 01/05/2022   CREATININE 0.71 04/15/2017   No results found for: "GFR"  Plan: Continue all antihypertensives at current dosages.Refill amlodipine x 90 days.  Monitor closely.  Continue to work on nutrition plan to promote weight loss and improve BP control.  Recheck labs next visit.

## 2022-11-24 NOTE — Assessment & Plan Note (Signed)
Insulin Resistance Last fasting insulin was 8.6. A1c was 5.5. Polyphagia:No Medication(s): Metformin 500 mg twice daily with meals No GI side effects with metformin.  She is working on Engineer, technical sales to decrease simple carbohydrates, increase lean proteins and exercise to promote weight loss, improve glycemic control and prevent progression to Type 2 diabetes.   Lab Results  Component Value Date   HGBA1C 5.5 08/11/2022   HGBA1C 5.1 01/05/2022   Lab Results  Component Value Date   INSULIN 8.6 08/11/2022    Plan: Continue and refill Metformin 500 mg twice daily with meals Increase protein intake. Increase fiber intake.  Continue working on nutrition plan to decrease simple carbohydrates, increase lean proteins and exercise to promote weight loss, improve glycemic control and prevent progression to Type 2 diabetes.  Recheck labs next visit

## 2022-11-24 NOTE — Assessment & Plan Note (Signed)
Vitamin D Deficiency Vitamin D is not at goal of 50.  Most recent vitamin D level was 19.3. She is on  prescription ergocalciferol 50,000 IU weekly. Lab Results  Component Value Date   VD25OH 19.3 (L) 08/11/2022   VD25OH 8.2 (L) 01/05/2022    Plan: Continue and refill  prescription ergocalciferol 50,000 IU weekly Low vitamin D levels can be associated with adiposity and may result in leptin resistance and weight gain. Also associated with fatigue. Currently on vitamin D supplementation without any adverse effects.  Recheck labs next visit.

## 2022-11-24 NOTE — Assessment & Plan Note (Signed)
Endorses fatigue even when allows for time to sleep, feels sleep is not always restorative.   Plan: Continue MVI, vitamin B 12 and Vitamin D and recheck labs next visit.

## 2022-11-27 ENCOUNTER — Other Ambulatory Visit (HOSPITAL_BASED_OUTPATIENT_CLINIC_OR_DEPARTMENT_OTHER): Payer: Self-pay

## 2022-11-28 ENCOUNTER — Other Ambulatory Visit (HOSPITAL_BASED_OUTPATIENT_CLINIC_OR_DEPARTMENT_OTHER): Payer: Self-pay

## 2022-12-01 ENCOUNTER — Other Ambulatory Visit (HOSPITAL_BASED_OUTPATIENT_CLINIC_OR_DEPARTMENT_OTHER): Payer: Self-pay

## 2022-12-24 ENCOUNTER — Ambulatory Visit (INDEPENDENT_AMBULATORY_CARE_PROVIDER_SITE_OTHER): Payer: 59 | Admitting: Physician Assistant

## 2022-12-24 DIAGNOSIS — E538 Deficiency of other specified B group vitamins: Secondary | ICD-10-CM

## 2022-12-24 DIAGNOSIS — I10 Essential (primary) hypertension: Secondary | ICD-10-CM

## 2022-12-24 DIAGNOSIS — E88819 Insulin resistance, unspecified: Secondary | ICD-10-CM

## 2022-12-24 DIAGNOSIS — E559 Vitamin D deficiency, unspecified: Secondary | ICD-10-CM

## 2023-01-07 ENCOUNTER — Other Ambulatory Visit (HOSPITAL_BASED_OUTPATIENT_CLINIC_OR_DEPARTMENT_OTHER): Payer: Self-pay

## 2023-03-03 ENCOUNTER — Encounter (HOSPITAL_BASED_OUTPATIENT_CLINIC_OR_DEPARTMENT_OTHER): Payer: Self-pay | Admitting: Family Medicine

## 2023-03-03 ENCOUNTER — Ambulatory Visit (INDEPENDENT_AMBULATORY_CARE_PROVIDER_SITE_OTHER): Payer: 59 | Admitting: Family Medicine

## 2023-03-03 ENCOUNTER — Other Ambulatory Visit (HOSPITAL_BASED_OUTPATIENT_CLINIC_OR_DEPARTMENT_OTHER): Payer: Self-pay

## 2023-03-03 ENCOUNTER — Other Ambulatory Visit (HOSPITAL_BASED_OUTPATIENT_CLINIC_OR_DEPARTMENT_OTHER): Payer: Self-pay | Admitting: Family Medicine

## 2023-03-03 VITALS — BP 138/98 | HR 81 | Ht 63.0 in | Wt 235.6 lb

## 2023-03-03 DIAGNOSIS — R300 Dysuria: Secondary | ICD-10-CM

## 2023-03-03 DIAGNOSIS — Z1231 Encounter for screening mammogram for malignant neoplasm of breast: Secondary | ICD-10-CM

## 2023-03-03 DIAGNOSIS — Z6841 Body Mass Index (BMI) 40.0 and over, adult: Secondary | ICD-10-CM

## 2023-03-03 DIAGNOSIS — Z8659 Personal history of other mental and behavioral disorders: Secondary | ICD-10-CM | POA: Insufficient documentation

## 2023-03-03 DIAGNOSIS — E538 Deficiency of other specified B group vitamins: Secondary | ICD-10-CM

## 2023-03-03 DIAGNOSIS — E559 Vitamin D deficiency, unspecified: Secondary | ICD-10-CM

## 2023-03-03 DIAGNOSIS — E88819 Insulin resistance, unspecified: Secondary | ICD-10-CM | POA: Diagnosis not present

## 2023-03-03 DIAGNOSIS — Z0000 Encounter for general adult medical examination without abnormal findings: Secondary | ICD-10-CM | POA: Diagnosis not present

## 2023-03-03 DIAGNOSIS — I10 Essential (primary) hypertension: Secondary | ICD-10-CM

## 2023-03-03 LAB — POCT URINALYSIS DIP (CLINITEK)
Bilirubin, UA: NEGATIVE
Glucose, UA: NEGATIVE mg/dL
Ketones, POC UA: NEGATIVE mg/dL
Nitrite, UA: NEGATIVE
POC PROTEIN,UA: NEGATIVE
Spec Grav, UA: 1.02 (ref 1.010–1.025)
Urobilinogen, UA: 0.2 E.U./dL
pH, UA: 6.5 (ref 5.0–8.0)

## 2023-03-03 MED ORDER — NITROFURANTOIN MONOHYD MACRO 100 MG PO CAPS
100.0000 mg | ORAL_CAPSULE | Freq: Two times a day (BID) | ORAL | 0 refills | Status: AC
  Filled 2023-03-03: qty 10, 5d supply, fill #0

## 2023-03-03 MED ORDER — METFORMIN HCL 500 MG PO TABS
500.0000 mg | ORAL_TABLET | Freq: Two times a day (BID) | ORAL | 3 refills | Status: AC
  Filled 2023-03-03: qty 180, 90d supply, fill #0
  Filled 2023-05-14: qty 180, 90d supply, fill #1
  Filled 2023-12-02: qty 180, 90d supply, fill #2

## 2023-03-03 MED ORDER — AMLODIPINE BESYLATE 5 MG PO TABS
5.0000 mg | ORAL_TABLET | Freq: Every day | ORAL | 3 refills | Status: DC
  Filled 2023-03-03: qty 90, 90d supply, fill #0

## 2023-03-03 MED ORDER — FLUCONAZOLE 150 MG PO TABS
ORAL_TABLET | ORAL | 0 refills | Status: DC
  Filled 2023-03-03: qty 2, 3d supply, fill #0

## 2023-03-03 MED ORDER — VALACYCLOVIR HCL 500 MG PO TABS
500.0000 mg | ORAL_TABLET | Freq: Every day | ORAL | 3 refills | Status: AC
  Filled 2023-03-03: qty 90, 90d supply, fill #0
  Filled 2023-07-30 – 2023-12-02 (×2): qty 90, 90d supply, fill #1

## 2023-03-03 MED ORDER — VITAMIN D (ERGOCALCIFEROL) 1.25 MG (50000 UNIT) PO CAPS
50000.0000 [IU] | ORAL_CAPSULE | ORAL | 2 refills | Status: AC
  Filled 2023-03-03: qty 6, 42d supply, fill #0
  Filled 2023-05-14: qty 6, 42d supply, fill #1
  Filled 2023-07-30: qty 6, 42d supply, fill #2
  Filled 2023-08-09: qty 4, 28d supply, fill #2
  Filled 2023-12-02: qty 2, 14d supply, fill #3

## 2023-03-03 MED ORDER — HYDROCHLOROTHIAZIDE 25 MG PO TABS
25.0000 mg | ORAL_TABLET | Freq: Every day | ORAL | 3 refills | Status: DC
  Filled 2023-03-03: qty 90, 90d supply, fill #0
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
## 2023-03-03 NOTE — Patient Instructions (Addendum)
Please schedule your mammogram and Pap smear.    Things to do to keep yourself healthy: - Exercise at least 30-45 minutes a day, 3-4 days a week.  - Eat a low-fat diet with lots of fruits and vegetables, up to 7-9 servings per day.  - Seatbelts can save your life. Wear them always.  - Smoke detectors on every level of your home, check batteries every year.  - Eye Doctor - have an eye exam every 1-2 years  - Safe sex - if you may be exposed to STDs, use a condom.  - No smoking, vaping, or use of any tobacco products.  - Alcohol -  If you drink, do it moderately, less than 2 drinks per day.  - No illegal drug use.  - Depression is common in our stressful world.If you're feeling down or losing interest in things you normally enjoy, please come in for a visit.  - Violence - If anyone is threatening or hurting you, please call immediately.

## 2023-03-04 ENCOUNTER — Other Ambulatory Visit (HOSPITAL_BASED_OUTPATIENT_CLINIC_OR_DEPARTMENT_OTHER): Payer: Self-pay | Admitting: Family Medicine

## 2023-03-04 DIAGNOSIS — Z0000 Encounter for general adult medical examination without abnormal findings: Secondary | ICD-10-CM | POA: Diagnosis not present

## 2023-03-04 DIAGNOSIS — E538 Deficiency of other specified B group vitamins: Secondary | ICD-10-CM | POA: Diagnosis not present

## 2023-03-08 NOTE — Progress Notes (Signed)
Urine culture negative for need of abx change.

## 2023-03-13 IMAGING — DX DG KNEE COMPLETE 4+V*R*
4 series · 4 of 4 positions shown · non-contrast
Comparison: None Available.

CLINICAL DATA: Chronic right knee pain.

EXAM:
RIGHT KNEE - COMPLETE 4+ VIEW

[knee ap]
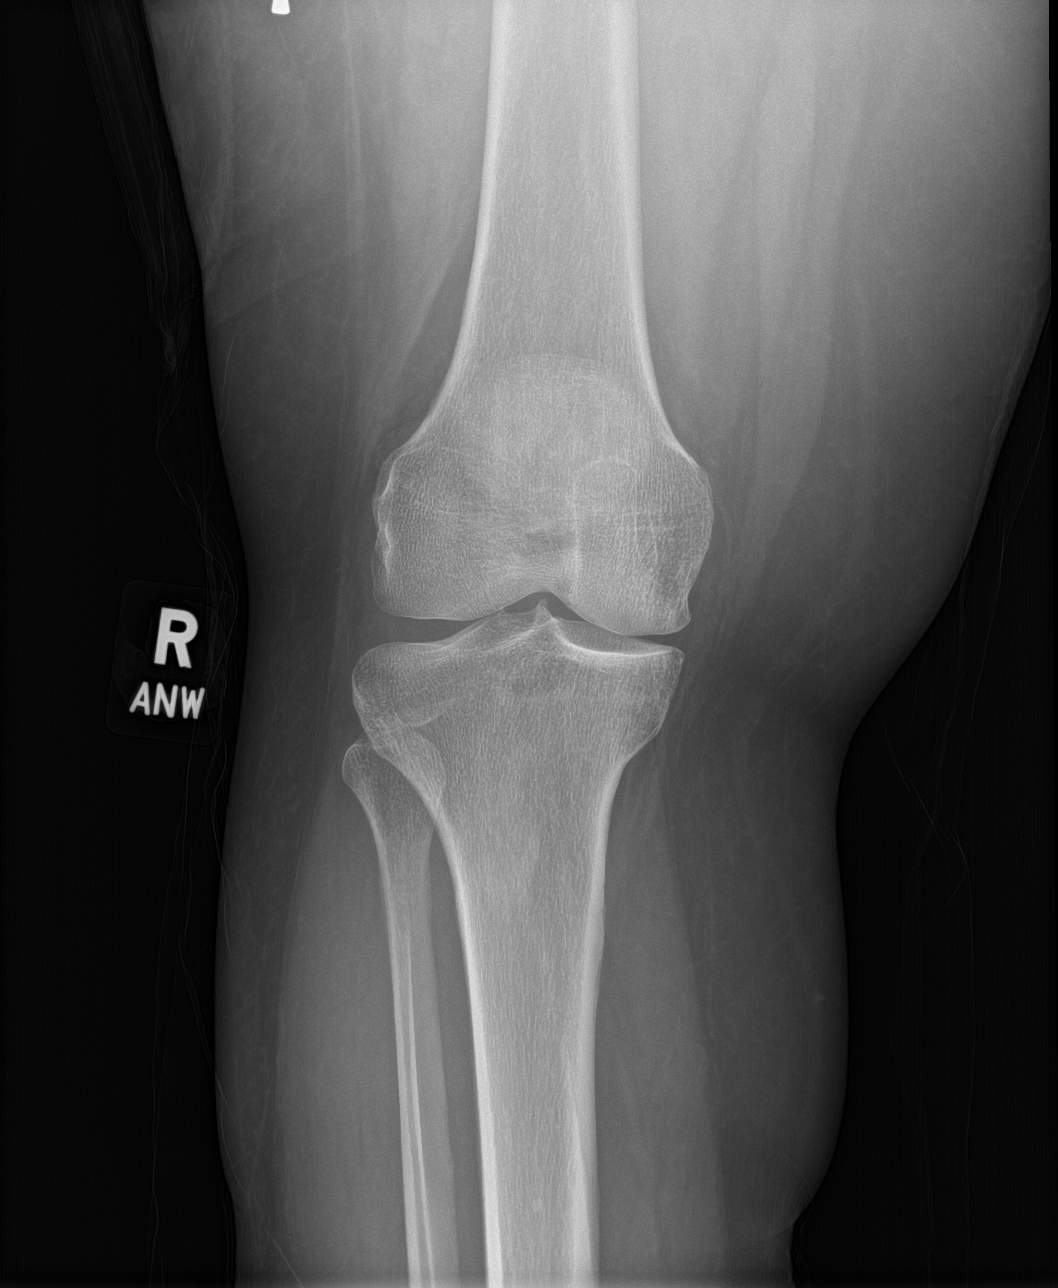

[knee tunnel]
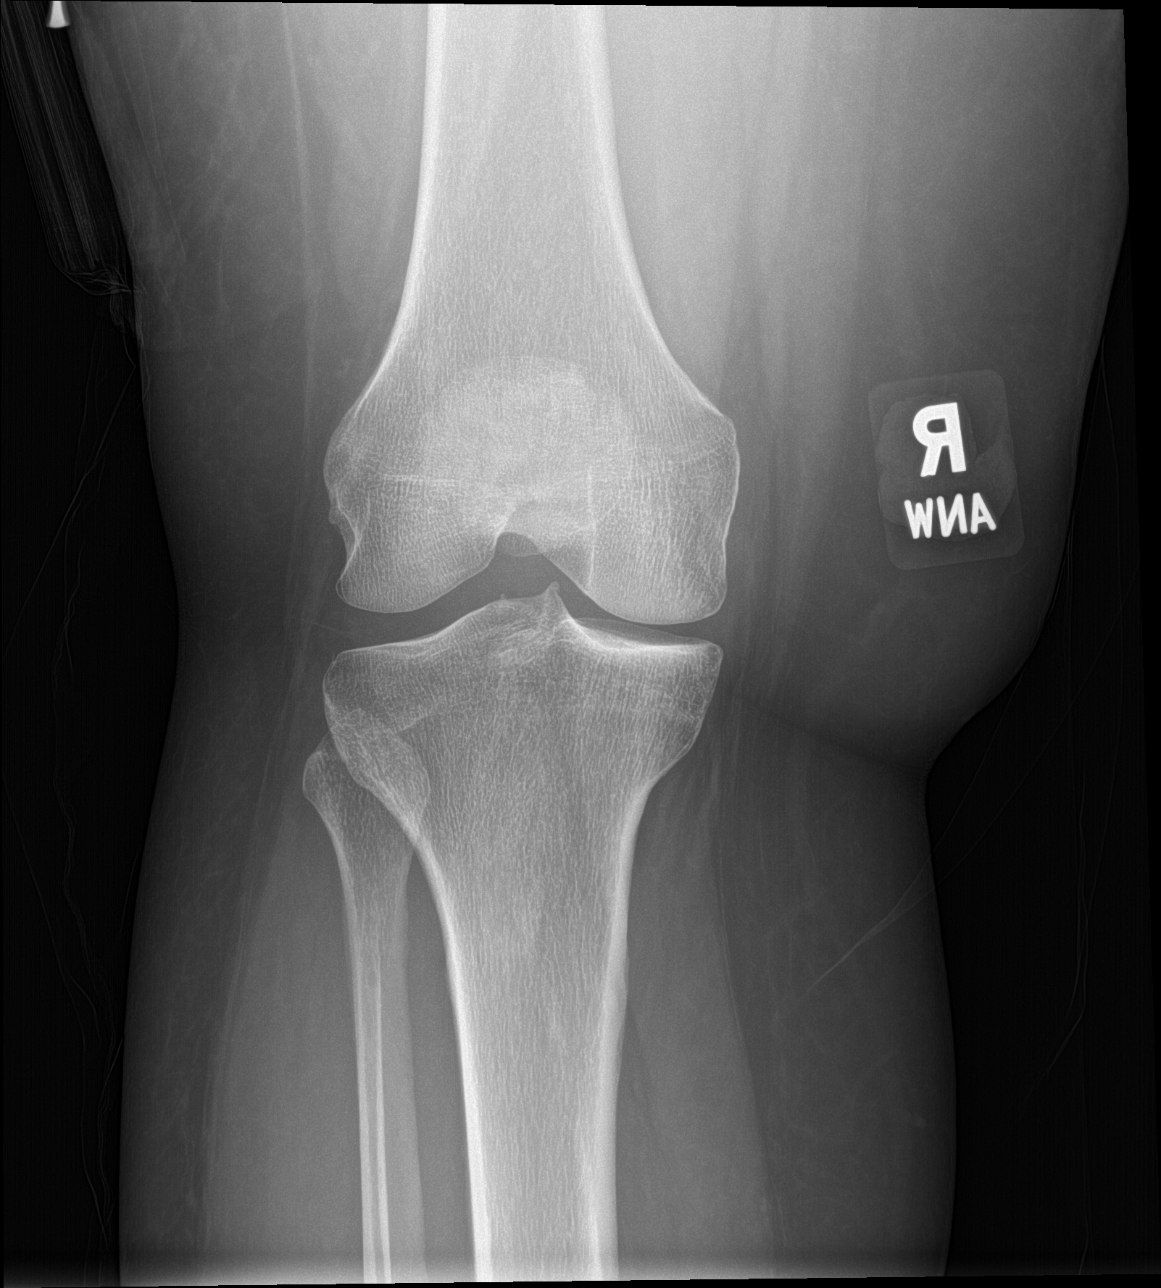

[patella sunrise]
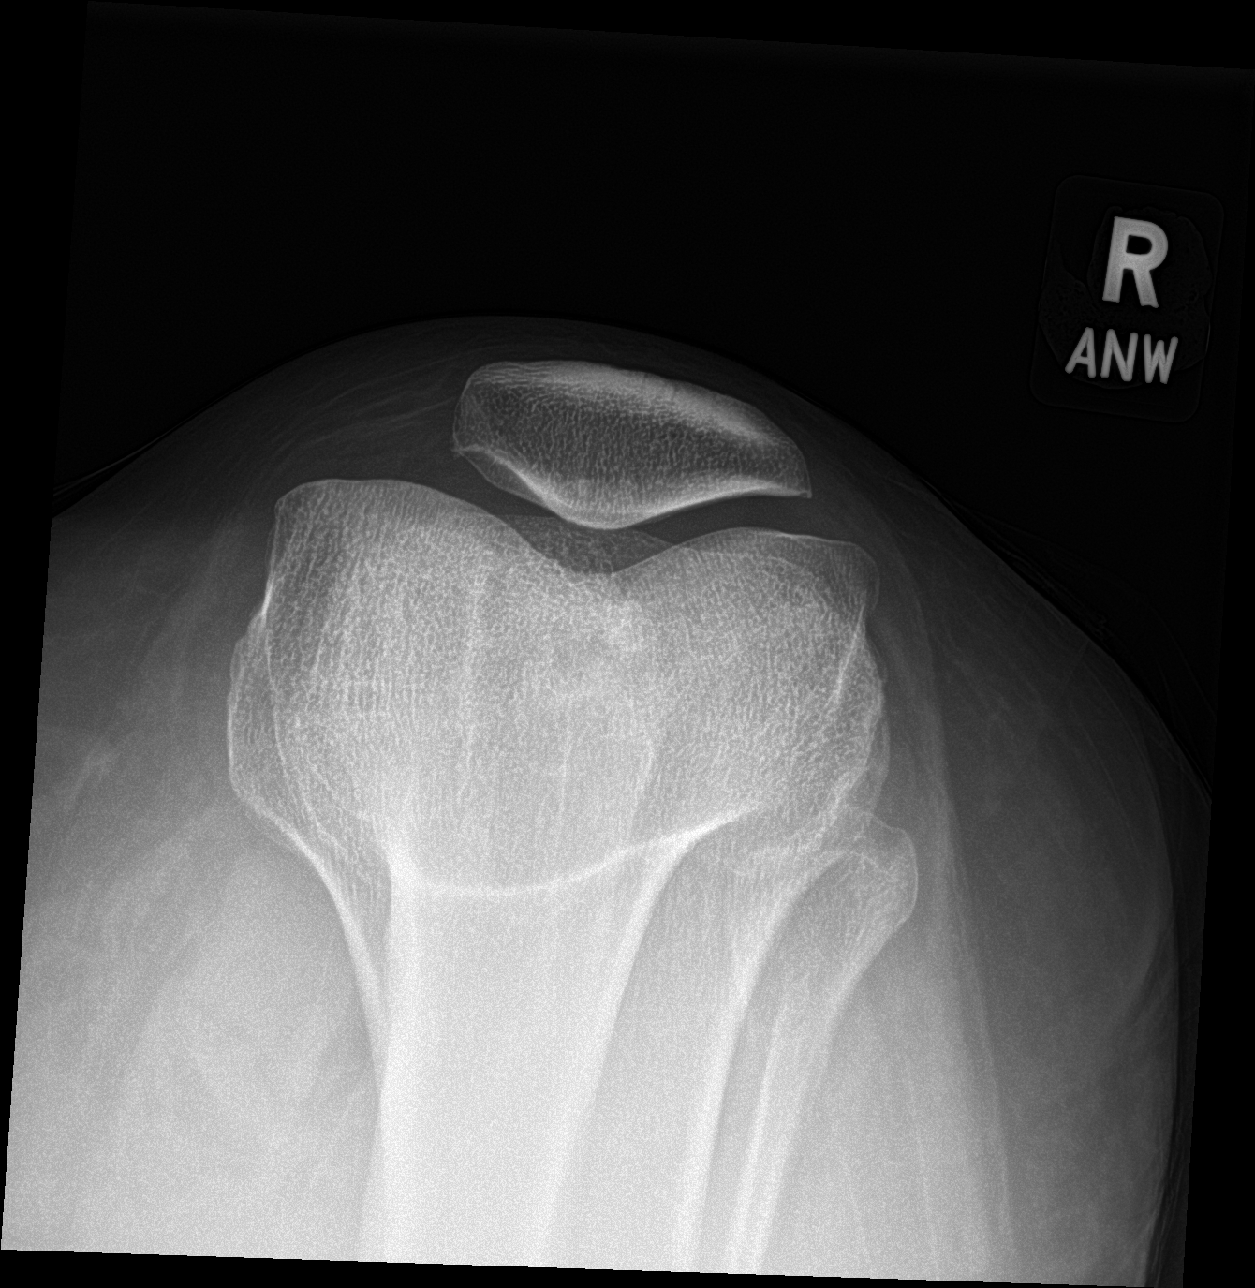

[knee lat]
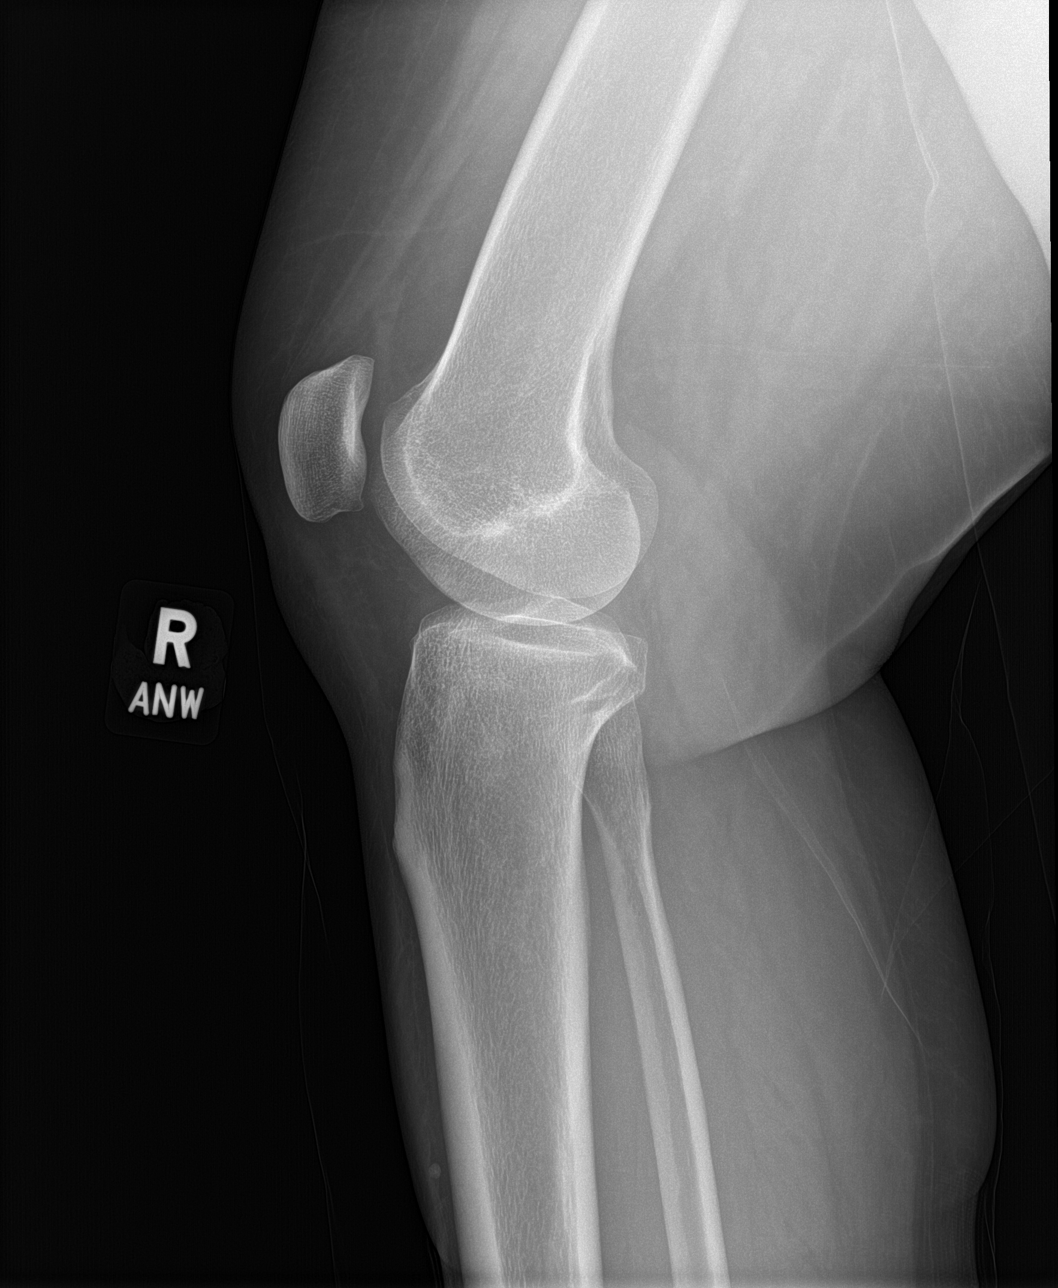

[4 of 4 positions shown; findings below may reference images not displayed]

FINDINGS: Minimal patellofemoral and medial joint space spurring. No fracture
or malalignment. No sizeable effusion.
IMPRESSION: Minimal degenerative change

## 2023-03-18 ENCOUNTER — Ambulatory Visit (HOSPITAL_BASED_OUTPATIENT_CLINIC_OR_DEPARTMENT_OTHER): Admission: RE | Admit: 2023-03-18 | Payer: 59 | Source: Ambulatory Visit | Admitting: Radiology

## 2023-03-31 ENCOUNTER — Encounter (HOSPITAL_BASED_OUTPATIENT_CLINIC_OR_DEPARTMENT_OTHER): Payer: Self-pay | Admitting: Family Medicine

## 2023-03-31 ENCOUNTER — Other Ambulatory Visit (HOSPITAL_BASED_OUTPATIENT_CLINIC_OR_DEPARTMENT_OTHER): Payer: Self-pay

## 2023-03-31 ENCOUNTER — Ambulatory Visit (INDEPENDENT_AMBULATORY_CARE_PROVIDER_SITE_OTHER): Payer: 59 | Admitting: Family Medicine

## 2023-03-31 VITALS — BP 138/84 | HR 73 | Ht 63.0 in | Wt 234.8 lb

## 2023-03-31 DIAGNOSIS — I10 Essential (primary) hypertension: Secondary | ICD-10-CM | POA: Diagnosis not present

## 2023-03-31 DIAGNOSIS — R319 Hematuria, unspecified: Secondary | ICD-10-CM

## 2023-03-31 MED ORDER — AMLODIPINE BESYLATE 10 MG PO TABS
10.0000 mg | ORAL_TABLET | Freq: Every day | ORAL | 3 refills | Status: DC
  Filled 2023-03-31 – 2023-05-14 (×2): qty 90, 90d supply, fill #0
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
## 2023-03-31 NOTE — Patient Instructions (Signed)
Increase your Amlodipine to 10mg . If you have 5mg  tablets left over, you can take 2 tablets at the same time. Recommend taking at night due to possibility of leg swelling.    Nurse BP Visit in 4 weeks.

## 2023-03-31 NOTE — Progress Notes (Signed)
Discussed labs with patient. She will continue vitamin d therapy and recheck in 6 months.

## 2023-04-09 ENCOUNTER — Other Ambulatory Visit (HOSPITAL_BASED_OUTPATIENT_CLINIC_OR_DEPARTMENT_OTHER): Payer: Self-pay

## 2023-04-28 ENCOUNTER — Encounter (HOSPITAL_BASED_OUTPATIENT_CLINIC_OR_DEPARTMENT_OTHER): Payer: Self-pay | Admitting: *Deleted

## 2023-04-28 ENCOUNTER — Other Ambulatory Visit (HOSPITAL_BASED_OUTPATIENT_CLINIC_OR_DEPARTMENT_OTHER): Payer: Self-pay | Admitting: Family Medicine

## 2023-04-28 ENCOUNTER — Ambulatory Visit (INDEPENDENT_AMBULATORY_CARE_PROVIDER_SITE_OTHER): Payer: 59 | Admitting: *Deleted

## 2023-04-28 DIAGNOSIS — R319 Hematuria, unspecified: Secondary | ICD-10-CM | POA: Diagnosis not present

## 2023-04-28 DIAGNOSIS — I10 Essential (primary) hypertension: Secondary | ICD-10-CM | POA: Diagnosis not present

## 2023-04-28 NOTE — Progress Notes (Signed)
Patient is in the office to have her BP checked. BP checked twice and both results have been documented. Stated that she did not take her BP meds this morning.  Patient also collected a urine sample as she has had history of hematuria and also wants to see if there is any bacteria in there too.

## 2023-04-29 LAB — URINALYSIS, ROUTINE W REFLEX MICROSCOPIC
Bilirubin, UA: NEGATIVE
Glucose, UA: NEGATIVE
Ketones, UA: NEGATIVE
Leukocytes,UA: NEGATIVE
Nitrite, UA: NEGATIVE
Protein,UA: NEGATIVE
RBC, UA: NEGATIVE
Specific Gravity, UA: 1.028 (ref 1.005–1.030)
Urobilinogen, Ur: 0.2 mg/dL (ref 0.2–1.0)
pH, UA: 6 (ref 5.0–7.5)

## 2023-04-29 NOTE — Progress Notes (Signed)
Urine is negative for hematuria. No further workup needed.

## 2023-05-14 ENCOUNTER — Other Ambulatory Visit (HOSPITAL_BASED_OUTPATIENT_CLINIC_OR_DEPARTMENT_OTHER): Payer: Self-pay

## 2023-05-26 ENCOUNTER — Other Ambulatory Visit (HOSPITAL_BASED_OUTPATIENT_CLINIC_OR_DEPARTMENT_OTHER): Payer: Self-pay

## 2023-05-26 DIAGNOSIS — Z124 Encounter for screening for malignant neoplasm of cervix: Secondary | ICD-10-CM | POA: Diagnosis not present

## 2023-05-26 MED ORDER — AMLODIPINE BESYLATE 5 MG PO TABS
5.0000 mg | ORAL_TABLET | Freq: Every day | ORAL | 3 refills | Status: AC
  Filled 2023-05-26 – 2023-12-02 (×4): qty 30, 30d supply, fill #0
  Filled 2024-03-31 – 2024-04-10 (×2): qty 30, 30d supply, fill #1

## 2023-05-26 MED ORDER — AMLODIPINE BESYLATE 2.5 MG PO TABS
2.5000 mg | ORAL_TABLET | Freq: Every day | ORAL | 3 refills | Status: AC
  Filled 2023-05-26 – 2023-06-30 (×2): qty 30, 30d supply, fill #0
  Filled 2023-07-30 – 2023-08-09 (×2): qty 30, 30d supply, fill #1
  Filled 2023-12-02: qty 30, 30d supply, fill #2
  Filled 2024-03-31 – 2024-04-10 (×2): qty 30, 30d supply, fill #3

## 2023-05-26 MED ORDER — HYDROXYZINE HCL 25 MG PO TABS
25.0000 mg | ORAL_TABLET | Freq: Every day | ORAL | 3 refills | Status: AC
  Filled 2023-05-26: qty 30, 30d supply, fill #0
  Filled 2023-07-30 – 2023-08-09 (×2): qty 30, 30d supply, fill #1
  Filled 2023-12-02: qty 30, 30d supply, fill #2
  Filled 2024-03-31 – 2024-04-10 (×2): qty 30, 30d supply, fill #3

## 2023-05-26 MED ORDER — METRONIDAZOLE 500 MG PO TABS
500.0000 mg | ORAL_TABLET | Freq: Two times a day (BID) | ORAL | 0 refills | Status: AC
  Filled 2023-05-26: qty 10, 5d supply, fill #0

## 2023-06-24 ENCOUNTER — Encounter (HOSPITAL_BASED_OUTPATIENT_CLINIC_OR_DEPARTMENT_OTHER): Payer: Self-pay | Admitting: Family Medicine

## 2023-06-24 ENCOUNTER — Ambulatory Visit (INDEPENDENT_AMBULATORY_CARE_PROVIDER_SITE_OTHER): Payer: 59

## 2023-06-24 VITALS — BP 143/107 | HR 83 | Ht 63.0 in | Wt 238.3 lb

## 2023-06-24 DIAGNOSIS — R059 Cough, unspecified: Secondary | ICD-10-CM

## 2023-06-24 LAB — POCT INFLUENZA A/B
Influenza A, POC: NEGATIVE
Influenza B, POC: NEGATIVE

## 2023-06-24 LAB — POC COVID19 BINAXNOW: SARS Coronavirus 2 Ag: NEGATIVE

## 2023-06-24 LAB — POCT RAPID STREP A (OFFICE): Rapid Strep A Screen: NEGATIVE

## 2023-06-24 NOTE — Progress Notes (Signed)
Patient presents in office today for sick symptoms of cough, sore throat, runny nose, a little chest tightness at night. She is requesting to be swabbed only, as she does not have time to wait and see the provider due to having to go back to work. Spoke with provider she was scheduled to see, Alyson Reedy, FNP-C gave verbal approval to swab patient and we will reach out if anything comes back positive. Patient also mentioned for her amlodipine dose, she is doing 7.5mg  not 10mg  as prescribed by her current pcp Jerre Simon, FNP

## 2023-06-30 ENCOUNTER — Other Ambulatory Visit (HOSPITAL_BASED_OUTPATIENT_CLINIC_OR_DEPARTMENT_OTHER): Payer: Self-pay

## 2023-06-30 MED ORDER — AZITHROMYCIN 250 MG PO TABS
ORAL_TABLET | ORAL | 0 refills | Status: DC
  Filled 2023-06-30: qty 6, 5d supply, fill #0

## 2023-07-30 ENCOUNTER — Other Ambulatory Visit (HOSPITAL_COMMUNITY): Payer: Self-pay

## 2023-07-30 ENCOUNTER — Other Ambulatory Visit: Payer: Self-pay

## 2023-08-03 ENCOUNTER — Other Ambulatory Visit: Payer: Self-pay

## 2023-08-09 ENCOUNTER — Other Ambulatory Visit: Payer: Self-pay

## 2023-08-09 ENCOUNTER — Other Ambulatory Visit (HOSPITAL_BASED_OUTPATIENT_CLINIC_OR_DEPARTMENT_OTHER): Payer: Self-pay

## 2023-08-25 ENCOUNTER — Other Ambulatory Visit (HOSPITAL_BASED_OUTPATIENT_CLINIC_OR_DEPARTMENT_OTHER): Payer: Self-pay

## 2023-08-25 ENCOUNTER — Ambulatory Visit (INDEPENDENT_AMBULATORY_CARE_PROVIDER_SITE_OTHER): Payer: Commercial Managed Care - PPO | Admitting: Dermatology

## 2023-08-25 ENCOUNTER — Encounter: Payer: Self-pay | Admitting: Dermatology

## 2023-08-25 DIAGNOSIS — L910 Hypertrophic scar: Secondary | ICD-10-CM | POA: Diagnosis not present

## 2023-08-25 DIAGNOSIS — L309 Dermatitis, unspecified: Secondary | ICD-10-CM

## 2023-08-25 MED ORDER — TRIAMCINOLONE ACETONIDE 0.1 % EX CREA
1.0000 | TOPICAL_CREAM | Freq: Two times a day (BID) | CUTANEOUS | 2 refills | Status: AC
  Filled 2023-08-25: qty 454, 90d supply, fill #0

## 2023-08-25 MED ORDER — TRIAMCINOLONE ACETONIDE 40 MG/ML IJ SUSP
40.0000 mg | Freq: Once | INTRAMUSCULAR | Status: AC
  Administered 2023-08-25: 40 mg via INTRAMUSCULAR

## 2023-08-25 NOTE — Progress Notes (Signed)
   New Patient Visit   Subjective  Maria Grimes is a 43 y.o. female who presents for the following: Keloid and Dermatis  Patient states she has keloid located at the vagina that she would like to have examined. Patient reports the areas have been there for several years. She reports the areas are bothersome. Patient rates irritation 10 out of 10. She states that the areas have not spread. Patient reports she has previously been treated for these areas. Patient denies Hx of bx. Patient denies family history of skin cancer(s).  Patient also has concerns about a rash on her B/L LE. Patient reports the areas are very itchy and have been there for several months. She has never been treated for this.  The following portions of the chart were reviewed this encounter and updated as appropriate: medications, allergies, medical history  Review of Systems:  No other skin or systemic complaints except as noted in HPI or Assessment and Plan.  Objective  Well appearing patient in no apparent distress; mood and affect are within normal limits.  A focused examination was performed of the following areas: Vagina  Relevant exam findings are noted in the Assessment and Plan.  Left Labium Majus (2), Right Labium Majus 3 Firm brown keloidal papule  Assessment & Plan   Keloids in Groin Area Assessment: Patient presents with recurrent keloids in the groin area, previously surgically excised and left open, with recurrence noted approximately one year ago. The patient reports tenderness and discomfort. Currently, the keloids have stabilized in growth, with three distinct lesions identified on examination.  Plan: Administer intralesional Kenalog 40 injections to all three keloid lesions. Schedule follow-up in 6-8 weeks for reassessment and potential additional injections. Inform patient that multiple sessions may be required, typically 1-3, for optimal results.  Pruritic Rash on Legs Assessment:  Patient reports a pruritic rash on the legs for approximately 2 months, with no identified triggers. Visual examination reveals redness, and faint papules.  Plan: Prescribe topical corticosteroid cream for symptomatic relief. Advise patient to apply the cream for 2 weeks. Educate patient on potential environmental triggers and irritants. DERMATITIS   Related Medications triamcinolone cream (KENALOG) 0.1 % Apply 1 Application topically 2 (two) times daily. KELOID (3) Left Labium Majus (2), Right Labium Majus Procedure Note Intralesional Injection  Location: Labia  Informed Consent: Discussed risks (infection, pain, bleeding, bruising, thinning of the skin, loss of skin pigment, lack of resolution, and recurrence of lesion) and benefits of the procedure, as well as the alternatives. Informed consent was obtained. Preparation: The area was prepared a standard fashion.  Anesthesia: None  Procedure Details: An intralesional injection was performed with Kenalog 40 mg/cc. 1 cc in total were injected. NDC #: 6213-0865-78 Exp: [id number]  Total number of injections: 4  Plan: The patient was instructed on post-op care. Recommend OTC analgesia as needed for pain.  triamcinolone acetonide (KENALOG-40) injection 40 mg - Left Labium Majus (2), Right Labium Majus   Return in about 8 weeks (around 10/20/2023) for Keloid & Skin Care Regimen F/U.  Documentation: I have reviewed the above documentation for accuracy and completeness, and I agree with the above.  Stasia Cavalier, am acting as scribe for Langston Reusing, DO.  Langston Reusing, DO

## 2023-08-25 NOTE — Patient Instructions (Addendum)
Hello Maria Grimes,  Thank you for visiting [place] today. We appreciate your commitment to improving your health and addressing your dermatological concerns. Here is a summary of the key instructions from today's consultation:  Kenalog Injections: Begin with Kenalog 40 injections for the keloids in your groin area. This treatment may require multiple sessions.   Frequency: Approximately every 6 to 8 weeks.   Goal: To achieve desired flatness and softness.  Rash on Thigh: Use triamcinolone cream for the itchy rash on your leg to alleviate irritation and itching.     Follow-Up Appointment: Please schedule a follow-up visit in 6 to 8 weeks.   Purpose: We will review the progress of the treatments and discuss a comprehensive skincare regimen.  Skincare Regimen: For your next visit, bring pictures of any skincare products you currently use or like.   Personalization: We will use these to help build a personalized skincare regimen.  Thank you once again for choosing our clinic for your dermatological needs. I look forward to seeing you at your next appointment and working together to achieve your health and skincare goals.  Warm regards,  Dr. [facility]    Important Information   Due to recent changes in healthcare laws, you may see results of your pathology and/or laboratory studies on MyChart before the doctors have had a chance to review them. We understand that in some cases there may be results that are confusing or concerning to you. Please understand that not all results are received at the same time and often the doctors may need to interpret multiple results in order to provide you with the best plan of care or course of treatment. Therefore, we ask that you please give [place] 2 business days to thoroughly review all your results before contacting the office for clarification. Should we see a critical lab result, you will be contacted sooner.     If You Need Anything After Your  Visit   If you have any questions or concerns for your doctor, please call our main line at [phone]. If no one answers, please leave a voicemail as directed and we will return your call as soon as possible. Messages left after 4 pm will be answered the following business day.    You may also send [place] a message via MyChart. We typically respond to MyChart messages within 1-2 business days.  For prescription refills, please ask your pharmacy to contact our office. Our fax number is [phone].  If you have an urgent issue when the clinic is closed that cannot wait until the next business day, you can page your doctor at the number below.     Please note that while we do our best to be available for urgent issues outside of office hours, we are not available 24/7.    If you have an urgent issue and are unable to reach [place], you may choose to seek medical care at your doctor's office, retail clinic, urgent care center, or emergency room.   If you have a medical emergency, please immediately call 911 or go to the emergency department. In the event of inclement weather, please call our main line at [phone] for an update on the status of any delays or closures.  Dermatology Medication Tips: Please keep the boxes that topical medications come in in order to help keep track of the instructions about where and how to use these. Pharmacies typically print the medication instructions only on the boxes and not directly on the  medication tubes.   If your medication is too expensive, please contact our office at [phone] or send [place] a message through MyChart.    We are unable to tell what your co-pay for medications will be in advance as this is different depending on your insurance coverage. However, we may be able to find a substitute medication at lower cost or fill out paperwork to get insurance to cover a needed medication.    If a prior authorization is required to get your medication  covered by your insurance company, please allow [place] 1-2 business days to complete this process.   Drug prices often vary depending on where the prescription is filled and some pharmacies may offer cheaper prices.   The website www.goodrx.com contains coupons for medications through different pharmacies. The prices here do not account for what the cost may be with help from insurance (it may be cheaper with your insurance), but the website can give you the price if you did not use any insurance.  - You can print the associated coupon and take it with your prescription to the pharmacy.  - You may also stop by our office during regular business hours and pick up a GoodRx coupon card.  - If you need your prescription sent electronically to a different pharmacy, notify our office through [facility] or by phone at [phone]

## 2023-08-27 ENCOUNTER — Other Ambulatory Visit (HOSPITAL_BASED_OUTPATIENT_CLINIC_OR_DEPARTMENT_OTHER): Payer: Self-pay

## 2023-08-30 ENCOUNTER — Encounter (HOSPITAL_BASED_OUTPATIENT_CLINIC_OR_DEPARTMENT_OTHER): Payer: Self-pay | Admitting: Family Medicine

## 2023-10-06 ENCOUNTER — Ambulatory Visit (HOSPITAL_BASED_OUTPATIENT_CLINIC_OR_DEPARTMENT_OTHER): Payer: 59 | Admitting: Family Medicine

## 2023-10-26 ENCOUNTER — Ambulatory Visit (INDEPENDENT_AMBULATORY_CARE_PROVIDER_SITE_OTHER): Payer: Commercial Managed Care - PPO | Admitting: Dermatology

## 2023-10-26 VITALS — BP 142/96

## 2023-10-26 DIAGNOSIS — L910 Hypertrophic scar: Secondary | ICD-10-CM

## 2023-10-26 DIAGNOSIS — L309 Dermatitis, unspecified: Secondary | ICD-10-CM

## 2023-10-26 MED ORDER — TRIAMCINOLONE ACETONIDE 40 MG/ML IJ SUSP
40.0000 mg | Freq: Once | INTRAMUSCULAR | Status: AC
  Administered 2023-10-26: 40 mg via INTRADERMAL

## 2023-10-26 NOTE — Patient Instructions (Signed)

## 2023-10-26 NOTE — Progress Notes (Signed)
   Follow-Up Visit   Subjective  Maria Grimes is a 43 y.o. female who presents for the following: Keloid follow up of the groin area - IL injection Kenalog 40 08/25/2023. She states they have gotten smaller. She is also follow ing up on dermatitis of her legs which is much better. She is applying TMC 0.1% cream at night.  The following portions of the chart were reviewed this encounter and updated as appropriate: medications, allergies, medical history  Review of Systems:  No other skin or systemic complaints except as noted in HPI or Assessment and Plan.  Objective  Well appearing patient in no apparent distress; mood and affect are within normal limits.   A focused examination was performed of the following areas: Legs and groin area   Relevant exam findings are noted in the Assessment and Plan.  Left Labium Majus, Right Labium Majus Firm pink/brown dermal papule(s)/plaque(s).   Assessment & Plan   1. Keloids - Assessment: Keloids have flattened by approximately 50% since the last treatment in January. Some areas have completely flattened, while others still require treatment. They were not itchy prior to injection. - Plan:    Inject 0.5cc total of Kenalog 40 into remaining keloid areas    Follow up in 8 weeks    Patient to cancel follow-up appointment if keloids are completely flat before the scheduled visit  2. Dermatitis - Assessment: Previously treated rash on the leg has resolved. Patient continues to use triamcinolone occasionally at night. Dry skin may be precipitating the dermatitis. - Plan:    Continue using Jargon moisturizer daily to prevent recurrence    Use triamcinolone as needed for flare-ups  KELOID (2) Left Labium Majus, Right Labium Majus Intralesional injection - Left Labium Majus, Right Labium Majus Location: Right labium majus, left labium majus  Informed Consent: Discussed risks (infection, pain, bleeding, bruising, thinning of the skin, loss of  skin pigment, lack of resolution, and recurrence of lesion) and benefits of the procedure, as well as the alternatives. Informed consent was obtained. Preparation: The area was prepared a standard fashion.  Anesthesia: none  Procedure Details: An intralesional injection was performed with Kenalog 40 mg/cc. 0.5 cc in total were injected.  Total number of injections: 7  Plan: The patient was instructed on post-op care. Recommend OTC analgesia as needed for pain.  Related Medications triamcinolone acetonide (KENALOG-40) injection 40 mg   Return in about 8 weeks (around 12/21/2023) for keloid follow up.  I, Joanie Coddington, CMA, am acting as scribe for [employer], DO .   Documentation: I have reviewed the above documentation for accuracy and completeness, and I agree with the above.  Langston Reusing, DO

## 2023-12-02 ENCOUNTER — Other Ambulatory Visit (HOSPITAL_BASED_OUTPATIENT_CLINIC_OR_DEPARTMENT_OTHER): Payer: Self-pay

## 2023-12-02 ENCOUNTER — Other Ambulatory Visit: Payer: Self-pay

## 2023-12-09 ENCOUNTER — Ambulatory Visit (INDEPENDENT_AMBULATORY_CARE_PROVIDER_SITE_OTHER): Admitting: Family Medicine

## 2023-12-09 ENCOUNTER — Encounter (HOSPITAL_BASED_OUTPATIENT_CLINIC_OR_DEPARTMENT_OTHER): Payer: Self-pay | Admitting: Family Medicine

## 2023-12-09 VITALS — BP 138/90 | HR 77 | Ht 63.0 in | Wt 242.7 lb

## 2023-12-09 DIAGNOSIS — E88819 Insulin resistance, unspecified: Secondary | ICD-10-CM | POA: Diagnosis not present

## 2023-12-09 DIAGNOSIS — I10 Essential (primary) hypertension: Secondary | ICD-10-CM

## 2023-12-09 DIAGNOSIS — E66813 Obesity, class 3: Secondary | ICD-10-CM

## 2023-12-09 DIAGNOSIS — Z6841 Body Mass Index (BMI) 40.0 and over, adult: Secondary | ICD-10-CM | POA: Diagnosis not present

## 2023-12-09 LAB — BASIC METABOLIC PANEL WITH GFR
BUN/Creatinine Ratio: 7 — ABNORMAL LOW (ref 9–23)
BUN: 5 mg/dL — ABNORMAL LOW (ref 6–24)
CO2: 24 mmol/L (ref 20–29)
Calcium: 9.1 mg/dL (ref 8.7–10.2)
Chloride: 103 mmol/L (ref 96–106)
Creatinine, Ser: 0.69 mg/dL (ref 0.57–1.00)
Glucose: 94 mg/dL (ref 70–99)
Potassium: 4.3 mmol/L (ref 3.5–5.2)
Sodium: 140 mmol/L (ref 134–144)
eGFR: 112 mL/min/{1.73_m2} (ref >59)

## 2023-12-09 LAB — HEMOGLOBIN A1C
Est. average glucose Bld gHb Est-mCnc: 108 mg/dL
Hgb A1c MFr Bld: 5.4 % (ref 4.8–5.6)
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
## 2023-12-09 NOTE — Patient Instructions (Addendum)
 Amlactin Daily Cream   Protein Intake 90g/ day.  Calculate your caloric deficit  Lose It App

## 2023-12-10 ENCOUNTER — Encounter (HOSPITAL_BASED_OUTPATIENT_CLINIC_OR_DEPARTMENT_OTHER): Payer: Self-pay | Admitting: Family Medicine

## 2023-12-10 NOTE — Progress Notes (Signed)
 Hi Kaileah, Your kidney function, electrolytes are normal.  Your A1c has stayed well-controlled out of the prediabetic range which is reassuring.

## 2023-12-13 ENCOUNTER — Encounter (HOSPITAL_BASED_OUTPATIENT_CLINIC_OR_DEPARTMENT_OTHER): Payer: Self-pay | Admitting: *Deleted

## 2023-12-15 ENCOUNTER — Ambulatory Visit: Admitting: Dermatology

## 2024-02-06 ENCOUNTER — Ambulatory Visit (HOSPITAL_COMMUNITY): Admission: EM | Admit: 2024-02-06 | Discharge: 2024-02-06 | Disposition: A | Discharge status: Home / Self Care

## 2024-02-06 ENCOUNTER — Encounter (HOSPITAL_COMMUNITY): Payer: Self-pay

## 2024-02-06 DIAGNOSIS — R110 Nausea: Secondary | ICD-10-CM

## 2024-02-06 DIAGNOSIS — B349 Viral infection, unspecified: Secondary | ICD-10-CM | POA: Diagnosis not present

## 2024-02-06 LAB — POCT RAPID STREP A (OFFICE): Rapid Strep A Screen: NEGATIVE

## 2024-02-06 MED ORDER — ONDANSETRON 4 MG PO TBDP
ORAL_TABLET | ORAL | Status: AC
  Filled 2024-02-06: qty 1

## 2024-02-06 MED ORDER — ONDANSETRON HCL 4 MG PO TABS
4.0000 mg | ORAL_TABLET | Freq: Three times a day (TID) | ORAL | 0 refills | Status: DC
Start: 2024-02-06 — End: 2024-03-23

## 2024-02-06 MED ORDER — ONDANSETRON 4 MG PO TBDP
4.0000 mg | ORAL_TABLET | Freq: Once | ORAL | Status: AC
  Administered 2024-02-06: 4 mg via ORAL

## 2024-02-06 NOTE — ED Triage Notes (Signed)
 Patient here today with c/o ST, fever, chills, sweats, body aches, and fatigue since Friday night.

## 2024-02-06 NOTE — Discharge Instructions (Addendum)
 We have sent in a medication to help with your nausea. Please try to drink plenty of fluids over the next several days. For most people and most upper respiratory infections, symptoms are self-limited. The usual course and duration of illness is up to 7-10 days. Because upper respiratory infections are usually caused by viruses, antibiotics will not help and often cause nausea and diarrhea.  Analgesics like Tylenol  (acetaminophen ) or Motrin  (ibuprofen ) may be used to relieve associated symptoms (eg, headache, ear pain, muscle and joint pains, and malaise). Supportive therapies are the main treatments you can perform including getting plenty of rest, drinking lots of fluids (water, juice, or broth), having warm tea or soup to help with sore throat, using cool-mist humidifier, using saline nose drops or spray to relieve stuffiness, and avoid smoking or being around smoke. You can also try over-the-counter medications including cough medicines that include an antihistamine and decongestant, cough drops, throat sprays, or inhaled/intranasal cromolyn sodium. You should return to the ED or UC if you experience any difficulty breathing, cough up blood, your symptoms do not improve/worsen, or if you are unable to keep down any food or liquids. Lastly, you should follow-up with your primary care provider in the next several days.
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
## 2024-03-09 ENCOUNTER — Ambulatory Visit (INDEPENDENT_AMBULATORY_CARE_PROVIDER_SITE_OTHER): Admitting: Family Medicine

## 2024-03-09 ENCOUNTER — Ambulatory Visit (HOSPITAL_BASED_OUTPATIENT_CLINIC_OR_DEPARTMENT_OTHER): Payer: Self-pay | Admitting: Family Medicine

## 2024-03-09 ENCOUNTER — Other Ambulatory Visit (HOSPITAL_BASED_OUTPATIENT_CLINIC_OR_DEPARTMENT_OTHER): Payer: Self-pay

## 2024-03-09 ENCOUNTER — Encounter (HOSPITAL_BASED_OUTPATIENT_CLINIC_OR_DEPARTMENT_OTHER): Payer: Self-pay | Admitting: Family Medicine

## 2024-03-09 ENCOUNTER — Ambulatory Visit (INDEPENDENT_AMBULATORY_CARE_PROVIDER_SITE_OTHER)

## 2024-03-09 VITALS — BP 150/98 | HR 77 | Temp 98.1°F | Ht 63.0 in | Wt 238.0 lb

## 2024-03-09 DIAGNOSIS — R059 Cough, unspecified: Secondary | ICD-10-CM | POA: Diagnosis not present

## 2024-03-09 DIAGNOSIS — R350 Frequency of micturition: Secondary | ICD-10-CM

## 2024-03-09 DIAGNOSIS — R062 Wheezing: Secondary | ICD-10-CM | POA: Diagnosis not present

## 2024-03-09 DIAGNOSIS — R0989 Other specified symptoms and signs involving the circulatory and respiratory systems: Secondary | ICD-10-CM | POA: Diagnosis not present

## 2024-03-09 LAB — POCT URINALYSIS DIP (CLINITEK)
Bilirubin, UA: NEGATIVE
Blood, UA: NEGATIVE
Glucose, UA: NEGATIVE mg/dL
Ketones, POC UA: NEGATIVE mg/dL
Leukocytes, UA: NEGATIVE
Nitrite, UA: NEGATIVE
POC PROTEIN,UA: NEGATIVE
Spec Grav, UA: 1.015 (ref 1.010–1.025)
Urobilinogen, UA: 0.2 U/dL
pH, UA: 6 (ref 5.0–8.0)

## 2024-03-09 MED ORDER — AZITHROMYCIN 250 MG PO TABS
ORAL_TABLET | ORAL | 0 refills | Status: AC
  Filled 2024-03-09: qty 6, 5d supply, fill #0

## 2024-03-09 MED ORDER — HYDROCODONE BIT-HOMATROP MBR 5-1.5 MG/5ML PO SOLN
5.0000 mL | Freq: Three times a day (TID) | ORAL | 0 refills | Status: DC | PRN
  Filled 2024-03-09: qty 120, 8d supply, fill #0

## 2024-03-09 MED ORDER — ALBUTEROL SULFATE HFA 108 (90 BASE) MCG/ACT IN AERS
2.0000 | INHALATION_SPRAY | Freq: Four times a day (QID) | RESPIRATORY_TRACT | 2 refills | Status: AC | PRN
  Filled 2024-03-09: qty 18, 25d supply, fill #0
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
## 2024-03-09 NOTE — Progress Notes (Signed)
 Maria Grimes,  Your chest xray  is negative for pneumonia. I will send in a zpack for you and recommend using plain Mucinex (not Dm due to your high blood pressure). I have also sent in cough syrup and an albuterol  inhaler to use as needed. Only use the cough syrup when resting at home and do not drive when taking it. If you are not feeling better in the next week, please let us  know.

## 2024-03-23 ENCOUNTER — Ambulatory Visit (INDEPENDENT_AMBULATORY_CARE_PROVIDER_SITE_OTHER): Admitting: Family Medicine

## 2024-03-23 ENCOUNTER — Other Ambulatory Visit (HOSPITAL_BASED_OUTPATIENT_CLINIC_OR_DEPARTMENT_OTHER): Payer: Self-pay

## 2024-03-23 ENCOUNTER — Encounter (HOSPITAL_BASED_OUTPATIENT_CLINIC_OR_DEPARTMENT_OTHER): Payer: Self-pay | Admitting: Family Medicine

## 2024-03-23 ENCOUNTER — Encounter (HOSPITAL_BASED_OUTPATIENT_CLINIC_OR_DEPARTMENT_OTHER): Payer: Self-pay

## 2024-03-23 ENCOUNTER — Ambulatory Visit (HOSPITAL_BASED_OUTPATIENT_CLINIC_OR_DEPARTMENT_OTHER)

## 2024-03-23 VITALS — BP 152/103 | HR 86 | Temp 98.1°F | Ht 64.0 in | Wt 239.0 lb

## 2024-03-23 DIAGNOSIS — I10 Essential (primary) hypertension: Secondary | ICD-10-CM | POA: Diagnosis not present

## 2024-03-23 DIAGNOSIS — J069 Acute upper respiratory infection, unspecified: Secondary | ICD-10-CM

## 2024-03-23 DIAGNOSIS — Z0000 Encounter for general adult medical examination without abnormal findings: Secondary | ICD-10-CM | POA: Diagnosis not present

## 2024-03-23 DIAGNOSIS — Z13220 Encounter for screening for lipoid disorders: Secondary | ICD-10-CM | POA: Diagnosis not present

## 2024-03-23 DIAGNOSIS — Z1231 Encounter for screening mammogram for malignant neoplasm of breast: Secondary | ICD-10-CM | POA: Diagnosis not present

## 2024-03-23 DIAGNOSIS — R252 Cramp and spasm: Secondary | ICD-10-CM | POA: Diagnosis not present

## 2024-03-23 MED ORDER — DOXYCYCLINE HYCLATE 100 MG PO TABS
100.0000 mg | ORAL_TABLET | Freq: Two times a day (BID) | ORAL | 0 refills | Status: DC
  Filled 2024-03-23: qty 14, 7d supply, fill #0
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
## 2024-03-24 ENCOUNTER — Ambulatory Visit (HOSPITAL_BASED_OUTPATIENT_CLINIC_OR_DEPARTMENT_OTHER): Admitting: *Deleted

## 2024-03-24 DIAGNOSIS — Z13220 Encounter for screening for lipoid disorders: Secondary | ICD-10-CM | POA: Diagnosis not present

## 2024-03-24 DIAGNOSIS — R252 Cramp and spasm: Secondary | ICD-10-CM | POA: Diagnosis not present

## 2024-03-24 DIAGNOSIS — Z0000 Encounter for general adult medical examination without abnormal findings: Secondary | ICD-10-CM | POA: Diagnosis not present

## 2024-03-24 NOTE — Progress Notes (Signed)
 Patient is in office today for a nurse visit for Blood Pressure Check. Patient blood pressure was 155/101, Patient No chest pain, No shortness of breath, No dyspnea on exertion, No orthopnea, No paroxysmal nocturnal dyspnea, No edema, No palpitations, No syncope Blood pressure recheck was  138/92.

## 2024-03-25 LAB — CBC WITH DIFFERENTIAL/PLATELET
Basophils Absolute: 0 x10E3/uL (ref 0.0–0.2)
Basos: 0 %
EOS (ABSOLUTE): 0.1 x10E3/uL (ref 0.0–0.4)
Eos: 1 %
Hematocrit: 39.5 % (ref 34.0–46.6)
Hemoglobin: 12.6 g/dL (ref 11.1–15.9)
Immature Grans (Abs): 0 x10E3/uL (ref 0.0–0.1)
Immature Granulocytes: 0 %
Lymphocytes Absolute: 2.7 x10E3/uL (ref 0.7–3.1)
Lymphs: 35 %
MCH: 29.6 pg (ref 26.6–33.0)
MCHC: 31.9 g/dL (ref 31.5–35.7)
MCV: 93 fL (ref 79–97)
Monocytes Absolute: 0.4 x10E3/uL (ref 0.1–0.9)
Monocytes: 6 %
Neutrophils Absolute: 4.5 x10E3/uL (ref 1.4–7.0)
Neutrophils: 58 %
Platelets: 295 x10E3/uL (ref 150–450)
RBC: 4.26 x10E6/uL (ref 3.77–5.28)
RDW: 13 % (ref 11.7–15.4)
WBC: 7.8 x10E3/uL (ref 3.4–10.8)

## 2024-03-25 LAB — COMPREHENSIVE METABOLIC PANEL WITH GFR
ALT: 27 IU/L (ref 0–32)
AST: 21 IU/L (ref 0–40)
Albumin: 4.3 g/dL (ref 3.9–4.9)
Alkaline Phosphatase: 120 IU/L (ref 44–121)
BUN/Creatinine Ratio: 12 (ref 9–23)
BUN: 9 mg/dL (ref 6–24)
Bilirubin Total: 0.4 mg/dL (ref 0.0–1.2)
CO2: 21 mmol/L (ref 20–29)
Calcium: 9.4 mg/dL (ref 8.7–10.2)
Chloride: 100 mmol/L (ref 96–106)
Creatinine, Ser: 0.74 mg/dL (ref 0.57–1.00)
Globulin, Total: 3.2 g/dL (ref 1.5–4.5)
Glucose: 79 mg/dL (ref 70–99)
Potassium: 4.3 mmol/L (ref 3.5–5.2)
Sodium: 138 mmol/L (ref 134–144)
Total Protein: 7.5 g/dL (ref 6.0–8.5)
eGFR: 104 mL/min/1.73 (ref >59)

## 2024-03-25 LAB — LIPID PANEL
Chol/HDL Ratio: 3.5 ratio (ref 0.0–4.4)
Cholesterol, Total: 161 mg/dL (ref 100–199)
HDL: 46 mg/dL (ref >39)
LDL Chol Calc (NIH): 102 mg/dL — ABNORMAL HIGH (ref 0–99)
Triglycerides: 67 mg/dL (ref 0–149)
VLDL Cholesterol Cal: 13 mg/dL (ref 5–40)

## 2024-03-25 LAB — MAGNESIUM: Magnesium: 2.1 mg/dL (ref 1.6–2.3)

## 2024-03-28 ENCOUNTER — Encounter (HOSPITAL_BASED_OUTPATIENT_CLINIC_OR_DEPARTMENT_OTHER): Payer: Self-pay | Admitting: Family Medicine

## 2024-03-28 ENCOUNTER — Ambulatory Visit (HOSPITAL_BASED_OUTPATIENT_CLINIC_OR_DEPARTMENT_OTHER): Payer: Self-pay | Admitting: Family Medicine

## 2024-03-28 NOTE — Progress Notes (Signed)
 Hi Maria Grimes,  Your cholesterol has increased slightly from the year prior. Continue a good heart healthy diet and regular exercise to control. Your magnesium and electrolytes are stable. Your kidney and liver function is normal. Your blood counts are stable without signs of anemia. We will plan to recheck these in 1 year. If you have further questions, please let me know.

## 2024-04-07 ENCOUNTER — Encounter (HOSPITAL_BASED_OUTPATIENT_CLINIC_OR_DEPARTMENT_OTHER): Payer: Self-pay | Admitting: Student

## 2024-04-07 ENCOUNTER — Ambulatory Visit (INDEPENDENT_AMBULATORY_CARE_PROVIDER_SITE_OTHER): Admitting: Student

## 2024-04-07 ENCOUNTER — Ambulatory Visit (HOSPITAL_BASED_OUTPATIENT_CLINIC_OR_DEPARTMENT_OTHER)

## 2024-04-07 DIAGNOSIS — M7989 Other specified soft tissue disorders: Secondary | ICD-10-CM | POA: Diagnosis not present

## 2024-04-07 DIAGNOSIS — M25572 Pain in left ankle and joints of left foot: Secondary | ICD-10-CM | POA: Diagnosis not present
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
## 2024-04-10 ENCOUNTER — Other Ambulatory Visit (HOSPITAL_BASED_OUTPATIENT_CLINIC_OR_DEPARTMENT_OTHER): Payer: Self-pay

## 2024-04-11 ENCOUNTER — Other Ambulatory Visit (HOSPITAL_BASED_OUTPATIENT_CLINIC_OR_DEPARTMENT_OTHER): Payer: Self-pay

## 2024-06-05 ENCOUNTER — Encounter: Payer: Self-pay | Admitting: Radiology

## 2024-06-23 ENCOUNTER — Other Ambulatory Visit (HOSPITAL_BASED_OUTPATIENT_CLINIC_OR_DEPARTMENT_OTHER): Payer: Self-pay | Admitting: Family Medicine

## 2024-06-23 ENCOUNTER — Encounter (HOSPITAL_BASED_OUTPATIENT_CLINIC_OR_DEPARTMENT_OTHER): Payer: Self-pay | Admitting: Family Medicine

## 2024-06-23 DIAGNOSIS — Z008 Encounter for other general examination: Secondary | ICD-10-CM

## 2024-06-23 NOTE — Telephone Encounter (Signed)
 Pt came up to pick up the paperwork. Orders placed for the varicella titer and quantiferon gold.

## 2024-06-27 LAB — QUANTIFERON-TB GOLD PLUS
QuantiFERON Mitogen Value: >10 [IU]/mL
QuantiFERON Nil Value: 0.1 [IU]/mL
QuantiFERON TB1 Ag Value: 0.08 [IU]/mL
QuantiFERON TB2 Ag Value: 0.06 [IU]/mL
QuantiFERON-TB Gold Plus: NEGATIVE

## 2024-06-27 LAB — VARICELLA ZOSTER ABS, IGG/IGM
Varicella IgM: <0.91 {index} (ref 0.00–0.90)
Varicella zoster IgG: REACTIVE

## 2024-06-28 ENCOUNTER — Ambulatory Visit (HOSPITAL_BASED_OUTPATIENT_CLINIC_OR_DEPARTMENT_OTHER): Payer: Self-pay | Admitting: Family Medicine

## 2024-06-28 NOTE — Progress Notes (Signed)
 Hi Sher,  Your TB test and varicella immunity are normal.

## 2024-08-13 ENCOUNTER — Encounter (HOSPITAL_COMMUNITY): Payer: Self-pay

## 2024-08-13 ENCOUNTER — Ambulatory Visit (HOSPITAL_COMMUNITY): Payer: Self-pay

## 2024-08-13 ENCOUNTER — Ambulatory Visit (HOSPITAL_COMMUNITY)

## 2024-08-13 ENCOUNTER — Ambulatory Visit (HOSPITAL_COMMUNITY)
Admission: EM | Admit: 2024-08-13 | Discharge: 2024-08-13 | Disposition: A | Discharge status: Home / Self Care | Attending: Student | Admitting: Student

## 2024-08-13 DIAGNOSIS — R399 Unspecified symptoms and signs involving the genitourinary system: Secondary | ICD-10-CM | POA: Insufficient documentation

## 2024-08-13 DIAGNOSIS — N760 Acute vaginitis: Secondary | ICD-10-CM | POA: Diagnosis not present

## 2024-08-13 LAB — POCT URINE DIPSTICK
Glucose, UA: NEGATIVE mg/dL
Nitrite, UA: NEGATIVE
POC PROTEIN,UA: 30 — AB
Spec Grav, UA: >=1.03 — AB
Urobilinogen, UA: 0.2 U/dL
pH, UA: 5.5

## 2024-08-13 LAB — POCT URINE PREGNANCY: Preg Test, Ur: NEGATIVE

## 2024-08-13 MED ORDER — FLUCONAZOLE 150 MG PO TABS: 150.0000 mg | ORAL_TABLET | Freq: Every day | ORAL | 0 refills | Status: AC

## 2024-08-13 MED ORDER — PREDNISONE 20 MG PO TABS: 40.0000 mg | ORAL_TABLET | Freq: Every day | ORAL | 0 refills | Status: AC
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
## 2024-08-13 NOTE — ED Triage Notes (Signed)
 Pt reports cough x 2 weeks. She is coughing up clear phlegm. Patient reports dysuria x 2 days and would like her urine check and self swab.

## 2024-08-13 NOTE — Discharge Instructions (Addendum)
-  For your yeast infection, start the Diflucan  (fluconazole )- Take one pill today (day 1). If you're still having symptoms in 3 days, take the second pill.  -We will call with any additional urine or vaginitis lab results in 2-3 days For your cough: -I will call within 2 hours with any abnormal xray results. You will not get a call with normal results.  -Prednisone , 2 pills taken at the same time for 5 days in a row.  Try taking this earlier in the day as it can give you energy. Avoid NSAIDs like ibuprofen  and alleve while taking this medication as they can increase your risk of stomach upset and even GI bleeding when in combination with a steroid. You can continue tylenol  (acetaminophen ) up to 1000mg  3x daily. -Continue albuterol  if it is helping -Your cough should slowly get better instead of worse. If you develop a cough productive of dark or red sputum, new shortness of breath, new chest tightness, new fevers, etc - seek additional care

## 2024-08-14 ENCOUNTER — Ambulatory Visit (HOSPITAL_COMMUNITY): Payer: Self-pay

## 2024-08-14 LAB — CERVICOVAGINAL ANCILLARY ONLY
Bacterial Vaginitis (gardnerella): NEGATIVE
Candida Glabrata: NEGATIVE
Candida Vaginitis: POSITIVE — AB
Chlamydia: NEGATIVE
Comment: NEGATIVE
Comment: NEGATIVE
Comment: NEGATIVE
Comment: NEGATIVE
Comment: NEGATIVE
Comment: NORMAL
Neisseria Gonorrhea: NEGATIVE
Trichomonas: NEGATIVE

## 2024-08-14 LAB — URINE CULTURE
